# Patient Record
Sex: Female | Born: 1970 | Race: White | Hispanic: Yes | Marital: Married | Smoking: Former smoker
Health system: Southern US, Community
[De-identification: ages and names within clinical notes are randomized; demographics above are authoritative.]

## PROBLEM LIST (undated history)

## (undated) DIAGNOSIS — F3181 Bipolar II disorder: Secondary | ICD-10-CM

## (undated) HISTORY — PX: HAND SURGERY: SHX662

## (undated) HISTORY — DX: Bipolar II disorder: F31.81

## (undated) HISTORY — PX: NO PAST SURGERIES: SHX2092

---

## 2000-07-20 ENCOUNTER — Other Ambulatory Visit: Admission: RE | Admit: 2000-07-20 | Discharge: 2000-07-20 | Payer: Self-pay | Admitting: *Deleted

## 2000-09-20 ENCOUNTER — Emergency Department (HOSPITAL_COMMUNITY): Admission: EM | Admit: 2000-09-20 | Discharge: 2000-09-20 | Payer: Self-pay | Admitting: Emergency Medicine

## 2001-06-01 ENCOUNTER — Encounter: Payer: Self-pay | Admitting: Emergency Medicine

## 2001-06-01 ENCOUNTER — Emergency Department (HOSPITAL_COMMUNITY): Admission: EM | Admit: 2001-06-01 | Discharge: 2001-06-01 | Payer: Self-pay | Admitting: Emergency Medicine

## 2002-03-03 ENCOUNTER — Emergency Department (HOSPITAL_COMMUNITY): Admission: EM | Admit: 2002-03-03 | Discharge: 2002-03-03 | Payer: Self-pay | Admitting: Emergency Medicine

## 2002-05-20 ENCOUNTER — Other Ambulatory Visit: Admission: RE | Admit: 2002-05-20 | Discharge: 2002-05-20 | Payer: Self-pay | Admitting: Obstetrics & Gynecology

## 2002-06-10 ENCOUNTER — Ambulatory Visit (HOSPITAL_COMMUNITY): Admission: RE | Admit: 2002-06-10 | Discharge: 2002-06-10 | Payer: Self-pay | Admitting: Obstetrics & Gynecology

## 2002-06-10 ENCOUNTER — Encounter: Payer: Self-pay | Admitting: Obstetrics & Gynecology

## 2005-04-02 ENCOUNTER — Emergency Department (HOSPITAL_COMMUNITY): Admission: EM | Admit: 2005-04-02 | Discharge: 2005-04-03 | Payer: Self-pay | Admitting: Emergency Medicine

## 2005-04-03 IMAGING — CR DG CHEST 2V
2 series · 2 of 2 positions shown · non-contrast
Comparison: None

CLINICAL DATA: Chest pain

CHEST - 2 VIEW:

[w chest pa]
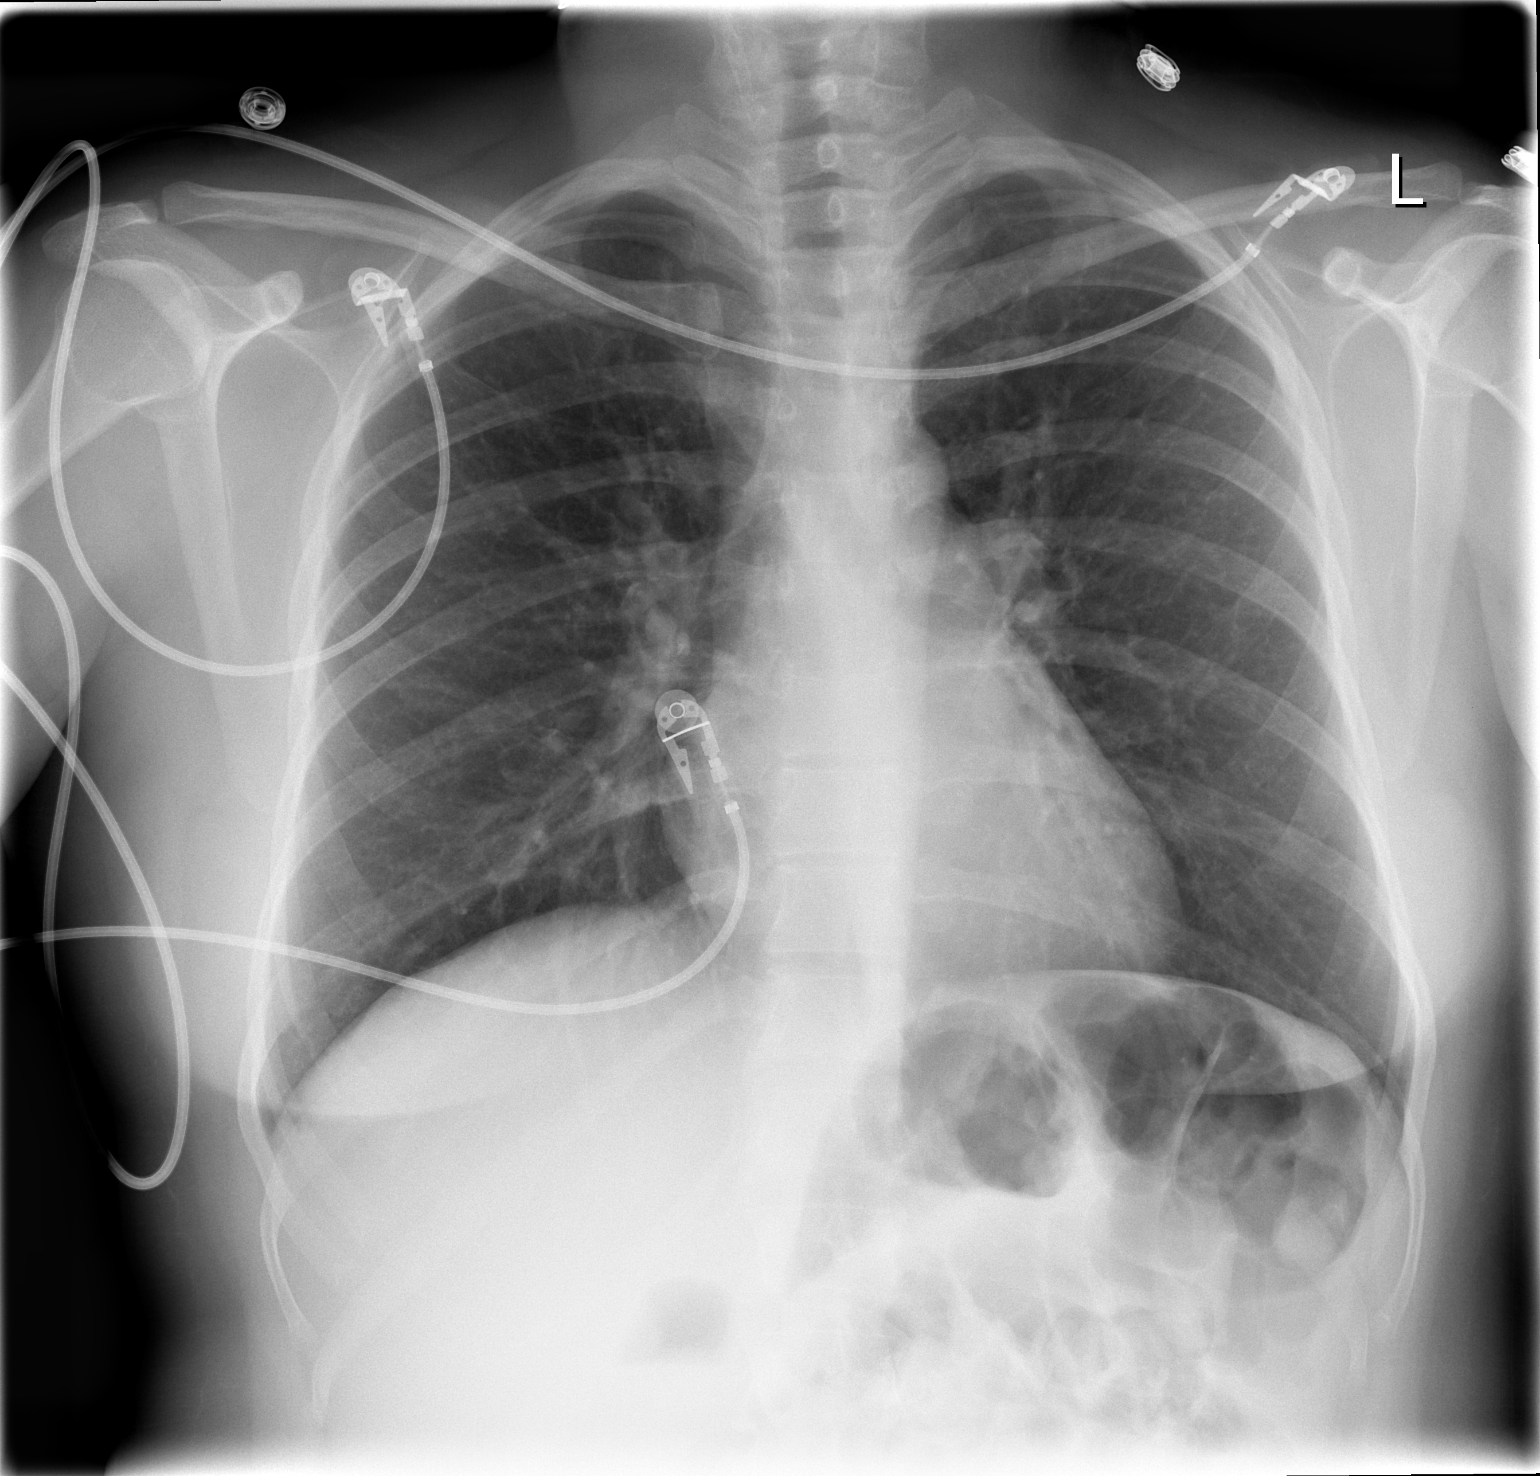

[w chest lat]
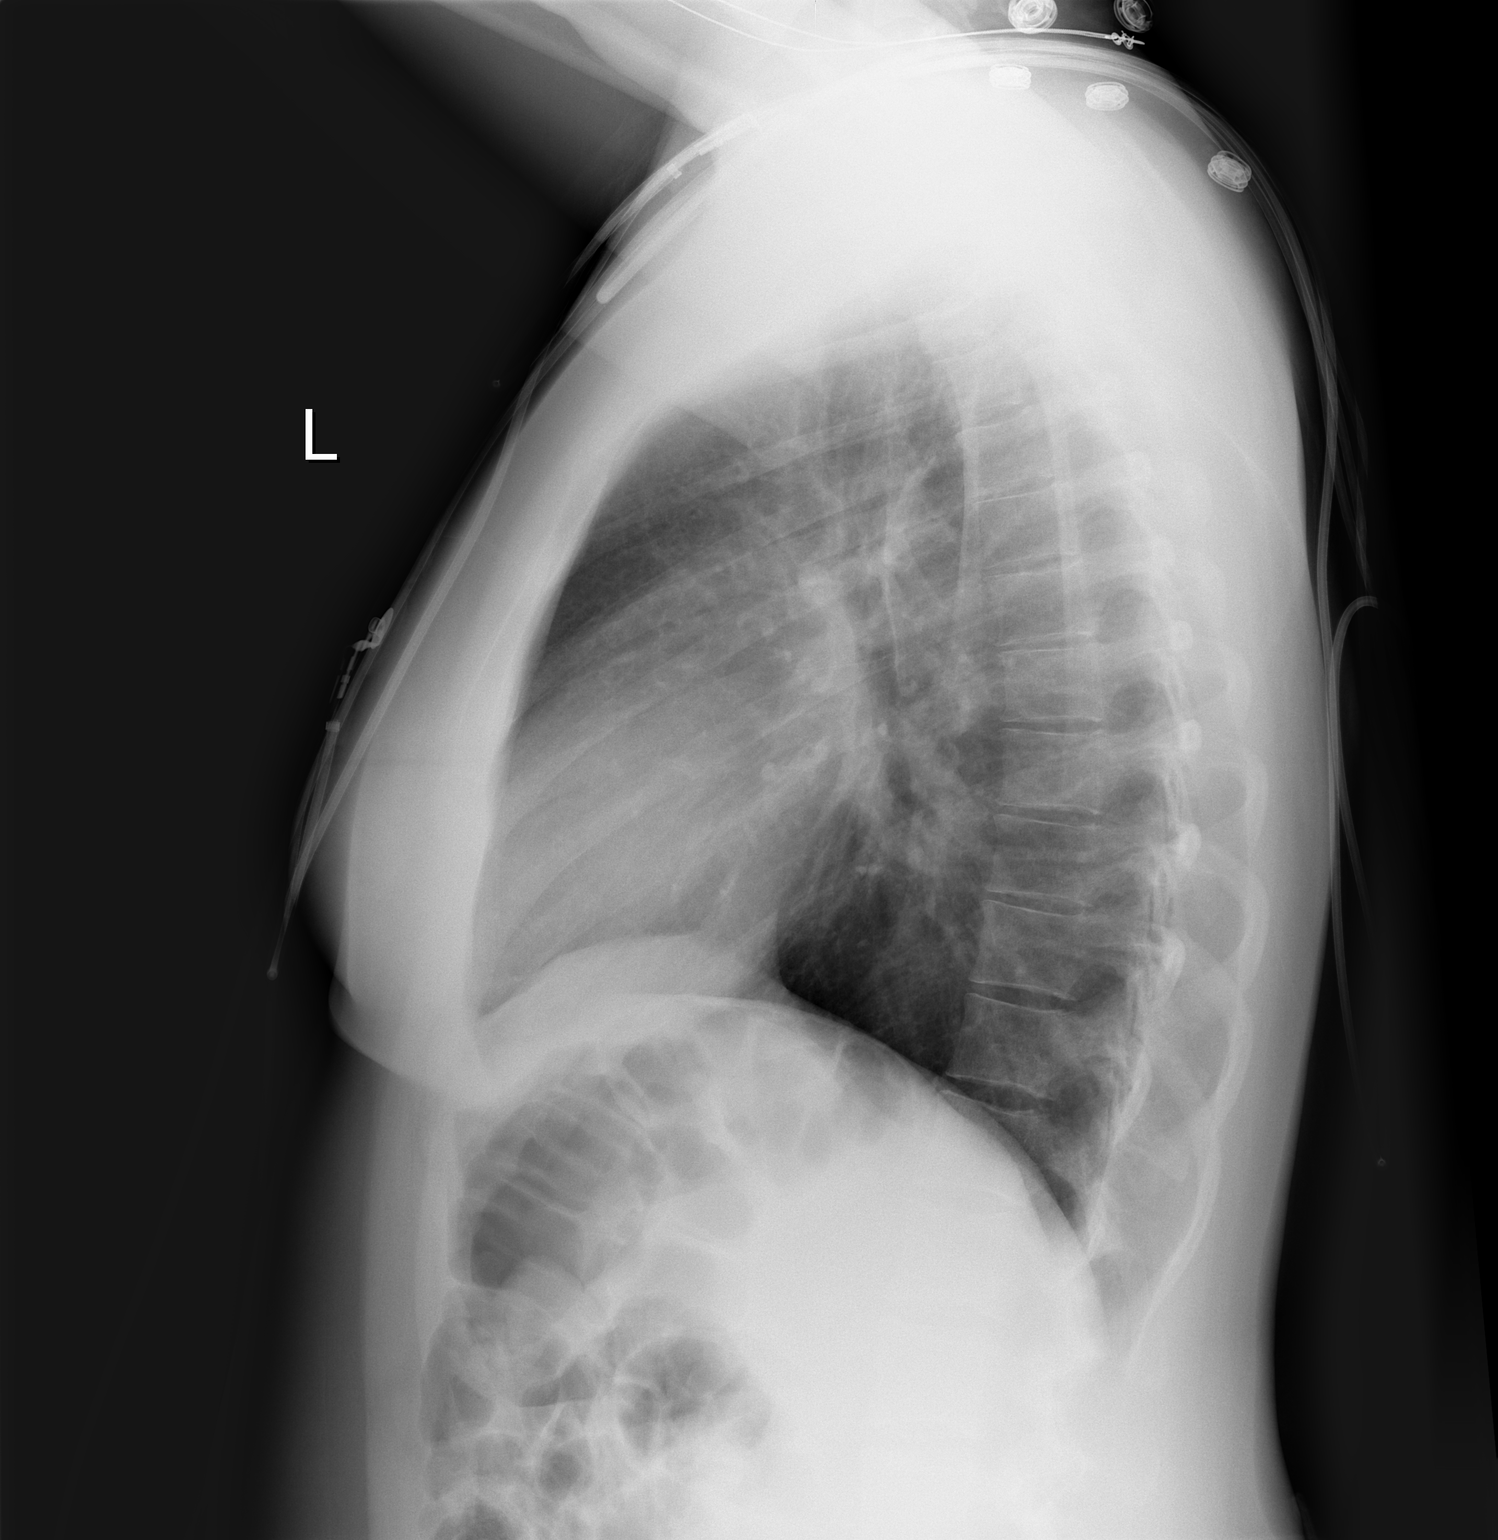

[2 of 2 positions shown; findings below may reference images not displayed]

FINDINGS: The heart size and mediastinal contours are within normal limits. 
Both lungs are clear.  The visualized skeletal structures are unremarkable.
IMPRESSION: No active cardiopulmonary disease

## 2006-05-11 ENCOUNTER — Emergency Department (HOSPITAL_COMMUNITY): Admission: EM | Admit: 2006-05-11 | Discharge: 2006-05-11 | Payer: Self-pay | Admitting: Emergency Medicine

## 2006-06-04 ENCOUNTER — Ambulatory Visit (HOSPITAL_BASED_OUTPATIENT_CLINIC_OR_DEPARTMENT_OTHER): Admission: RE | Admit: 2006-06-04 | Discharge: 2006-06-04 | Payer: Self-pay | Admitting: General Surgery

## 2011-11-21 ENCOUNTER — Ambulatory Visit (HOSPITAL_COMMUNITY): Payer: Self-pay | Admitting: Psychology

## 2011-11-25 ENCOUNTER — Ambulatory Visit (HOSPITAL_COMMUNITY): Payer: Self-pay | Admitting: Psychiatry

## 2011-12-02 ENCOUNTER — Ambulatory Visit (HOSPITAL_COMMUNITY): Payer: Self-pay | Admitting: Psychology

## 2015-11-02 ENCOUNTER — Other Ambulatory Visit: Payer: Self-pay | Admitting: Obstetrics & Gynecology

## 2015-11-02 DIAGNOSIS — N6325 Unspecified lump in the left breast, overlapping quadrants: Secondary | ICD-10-CM

## 2015-11-02 DIAGNOSIS — N6320 Unspecified lump in the left breast, unspecified quadrant: Principal | ICD-10-CM

## 2015-11-09 ENCOUNTER — Ambulatory Visit
Admission: RE | Admit: 2015-11-09 | Discharge: 2015-11-09 | Disposition: A | Discharge status: Home / Self Care | Payer: BLUE CROSS/BLUE SHIELD | Source: Ambulatory Visit | Attending: Obstetrics & Gynecology | Admitting: Obstetrics & Gynecology

## 2015-11-09 DIAGNOSIS — N6325 Unspecified lump in the left breast, overlapping quadrants: Secondary | ICD-10-CM

## 2015-11-09 DIAGNOSIS — N6320 Unspecified lump in the left breast, unspecified quadrant: Principal | ICD-10-CM

## 2015-11-09 IMAGING — MG 2D DIGITAL DIAGNOSTIC BILATERAL MAMMOGRAM WITH CAD AND ADJUNCT T
8 of 12 series · 8 of 28 positions shown · non-contrast
Comparison: Previous exam(s).

CLINICAL DATA: Ordering physician describes a palpable lump in the
lower left breast.

EXAM:
2D DIGITAL DIAGNOSTIC BILATERAL MAMMOGRAM WITH CAD AND ADJUNCT TOMO
ULTRASOUND LEFT BREAST

[R CC]
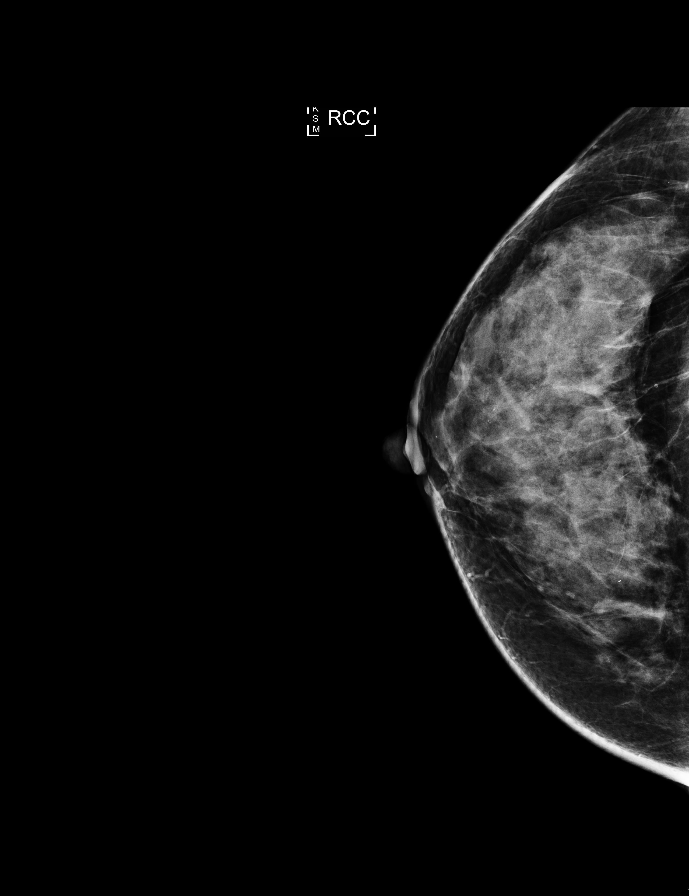

[R MLO]
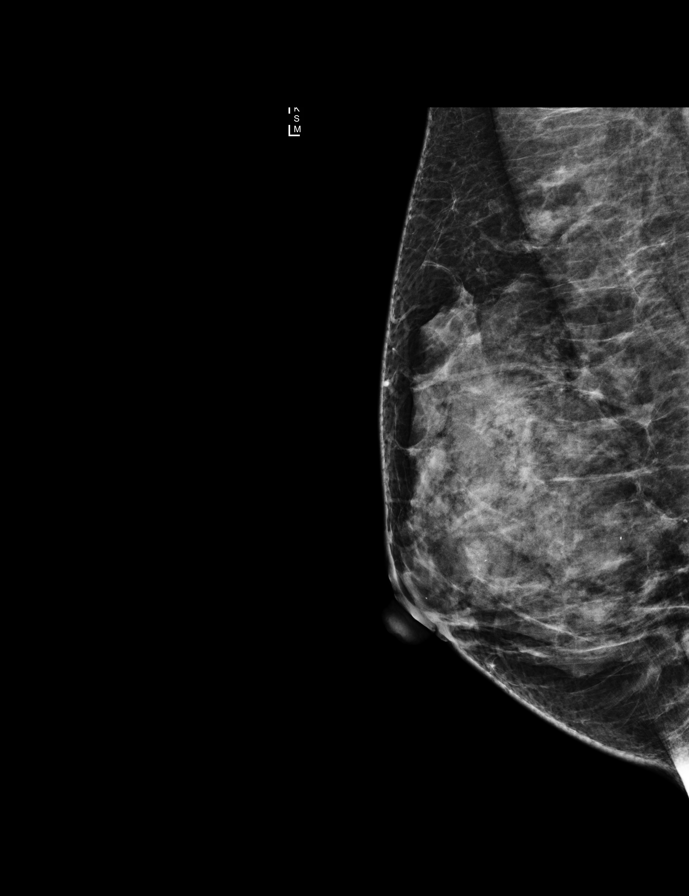

[L CC]
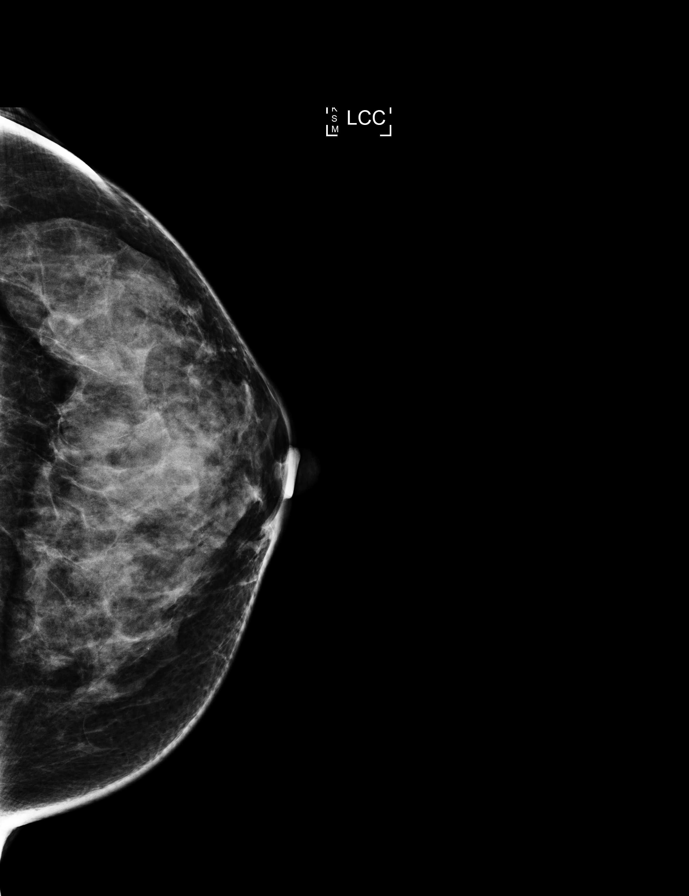

[R CC synth-2D]
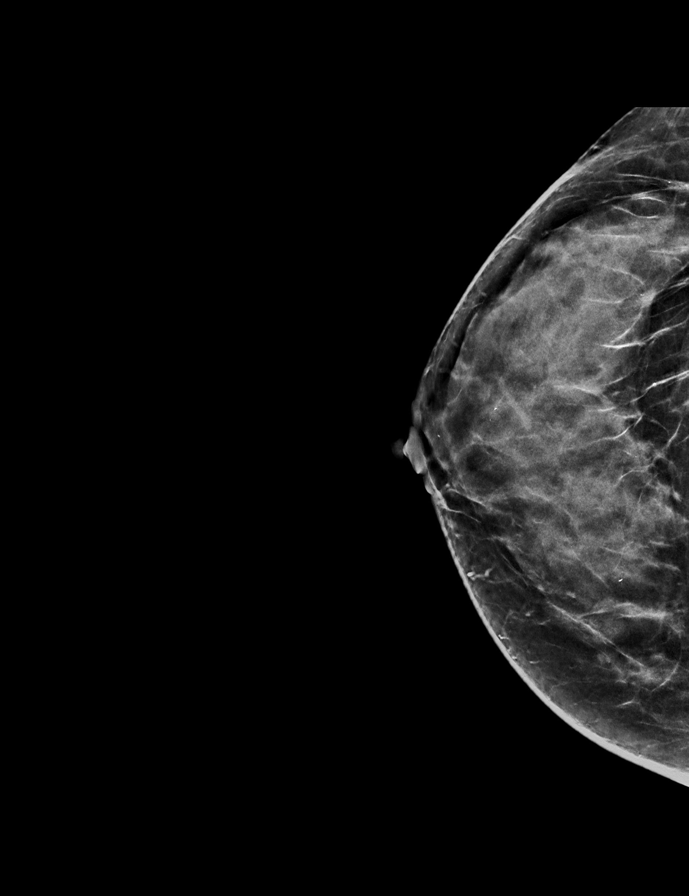

[R MLO synth-2D]
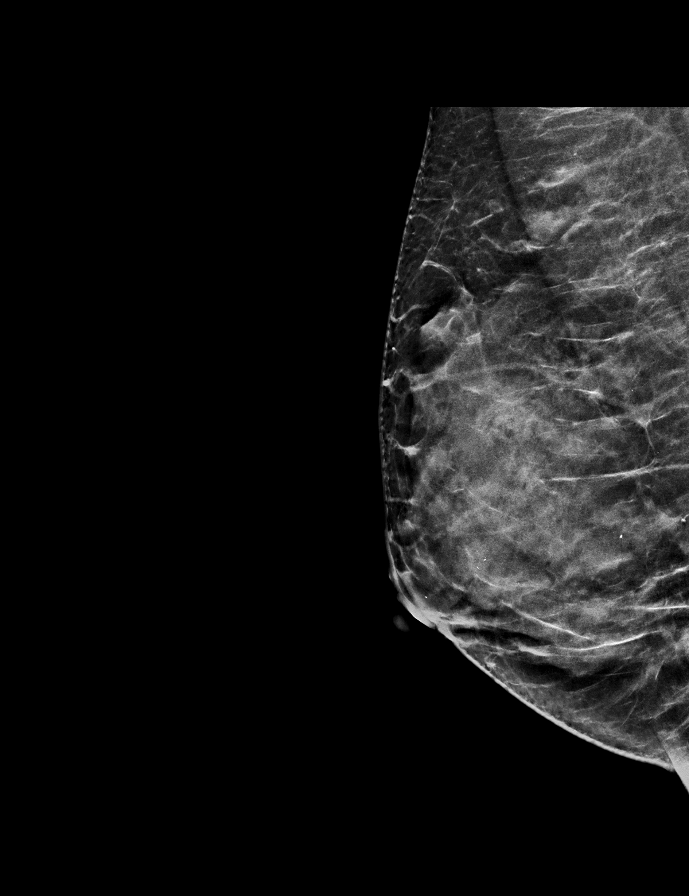

[L MLO]
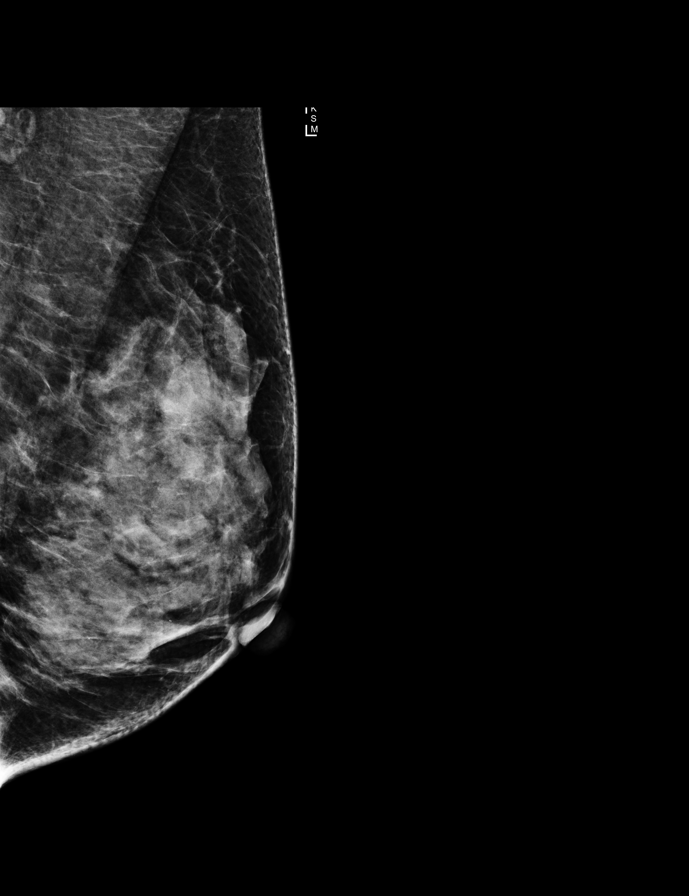

[L CC synth-2D]
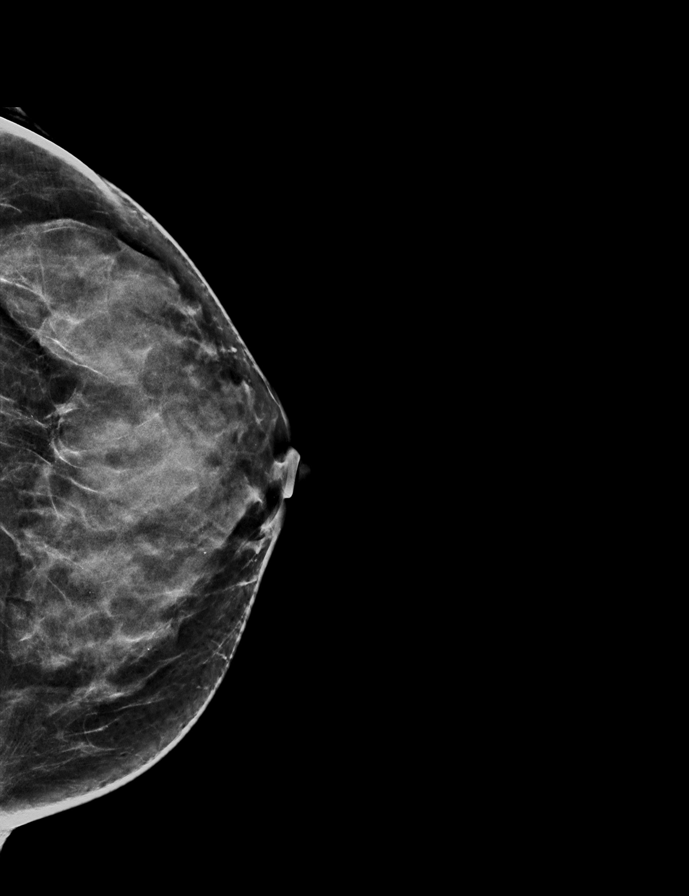

[L MLO synth-2D]
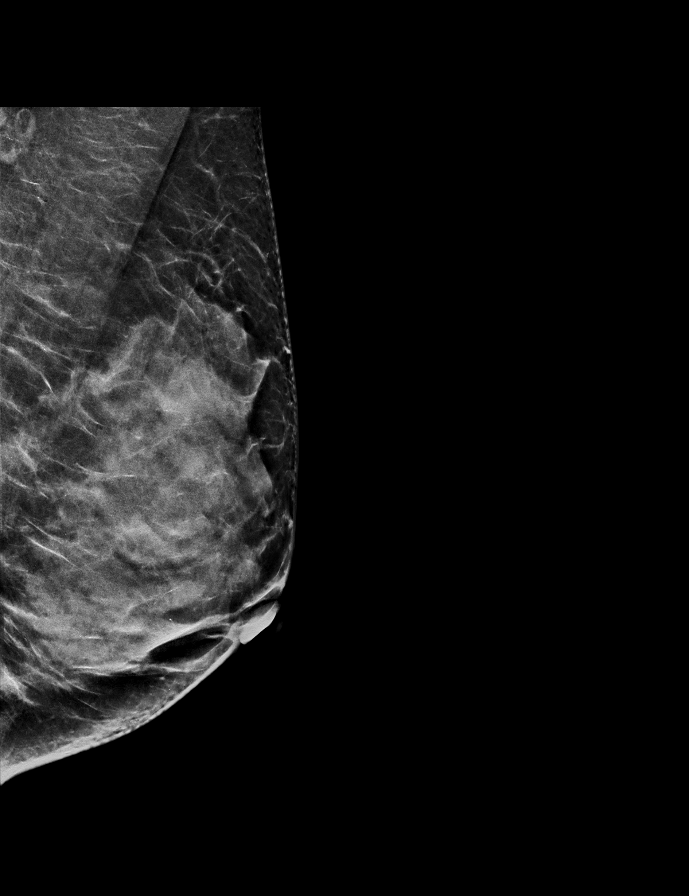

[8 of 28 positions shown; findings below may reference images not displayed]

ACR Breast Density Category d: The breast tissue is extremely dense,
which lowers the sensitivity of mammography.
FINDINGS: 2D CC and MLO views were obtained of each breast, with additional 3D
tomosynthesis. There are no dominant masses, suspicious
calcifications or secondary signs of malignancy within either
breast.

Mammographic images were processed with CAD.

Targeted ultrasound is performed, evaluating the lower left breast
from the [DATE] to [DATE] axes. There are scattered benign cysts within
the lower left breast, some simple and some complicated with
internal debris, largest measuring 4 mm. There are no suspicious
solid or cystic masses identified within the lower left breast.
IMPRESSION: No evidence of malignancy within either breast.

Scattered benign cysts within the lower left breast, corresponding
to the area of palpable abnormality.

RECOMMENDATION:
Screening mammogram in one year.(Code:[KB])

I have discussed the findings and recommendations with the patient.
Results were also provided in writing at the conclusion of the
visit. If applicable, a reminder letter will be sent to the patient
regarding the next appointment.

BI-RADS CATEGORY  2: Benign.

## 2015-11-09 IMAGING — US ULTRASOUND LEFT BREAST LIMITED
1 series · 11 of 11 positions shown · non-contrast
Comparison: Previous exam(s).

CLINICAL DATA: Ordering physician describes a palpable lump in the
lower left breast.

EXAM:
2D DIGITAL DIAGNOSTIC BILATERAL MAMMOGRAM WITH CAD AND ADJUNCT TOMO
ULTRASOUND LEFT BREAST

[Series 1: ultrasound left breast limited · 0.04mm/px · 11 of 11 slices shown]
[im 1/11]
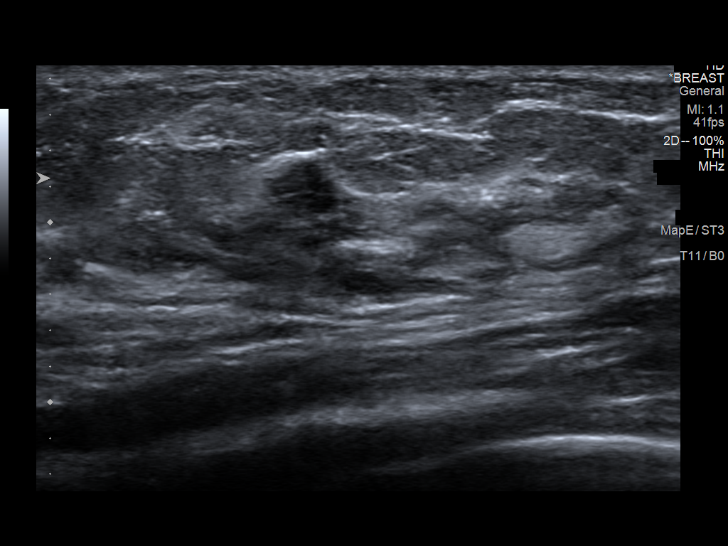
[im 2/11]
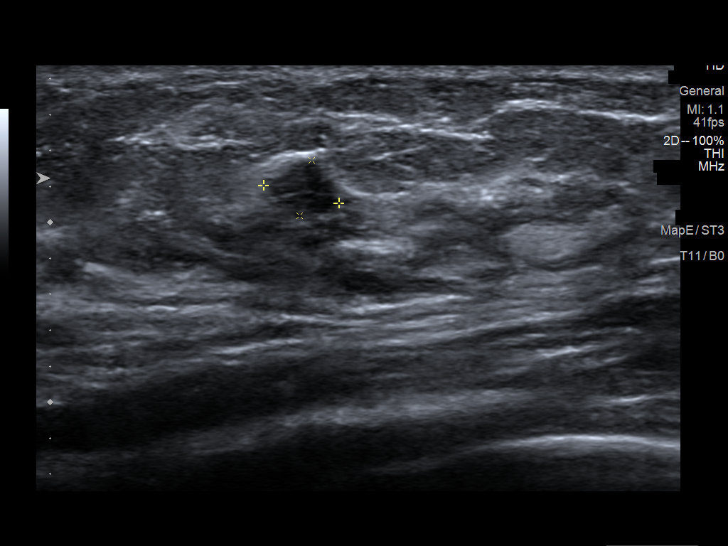
[im 3/11]
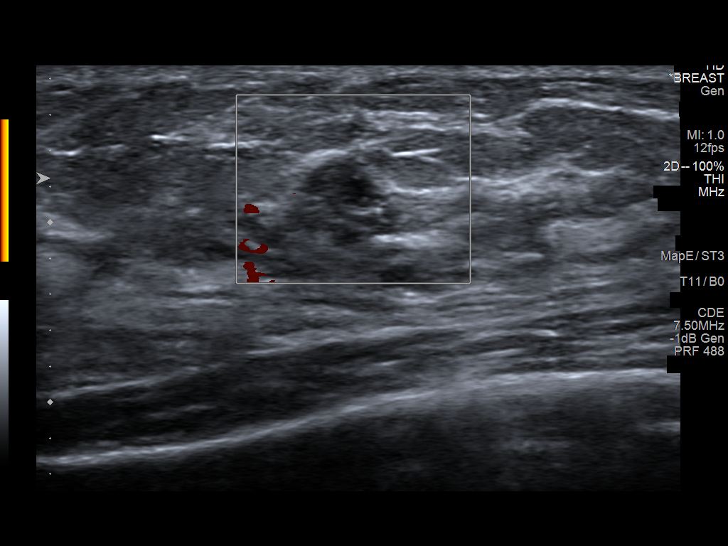
[im 4/11]
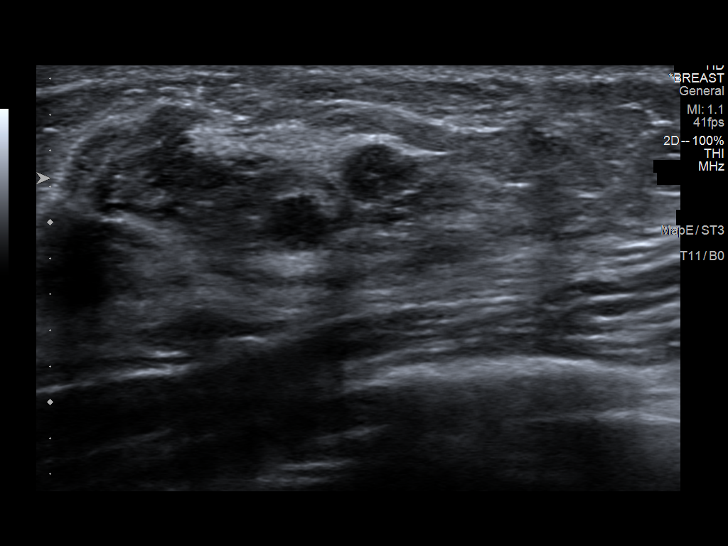
[im 5/11]
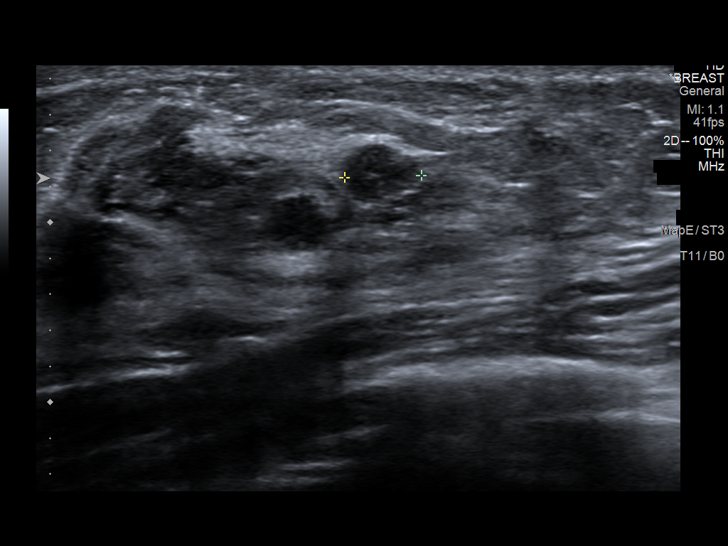
[im 6/11]
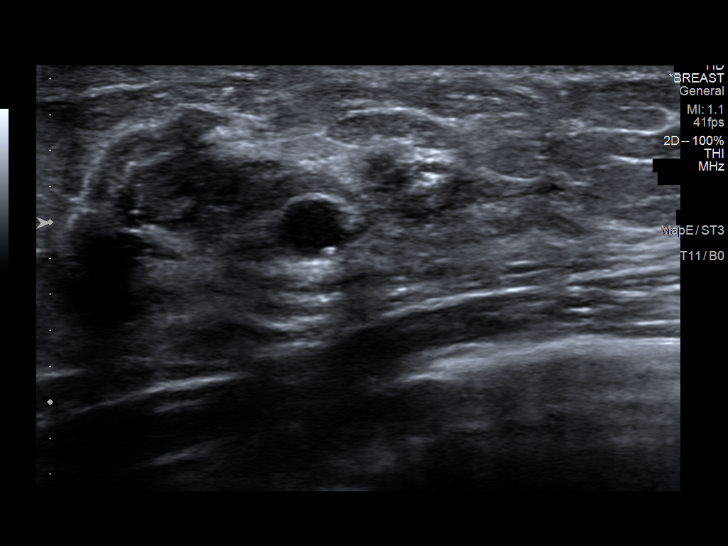
[im 7/11]
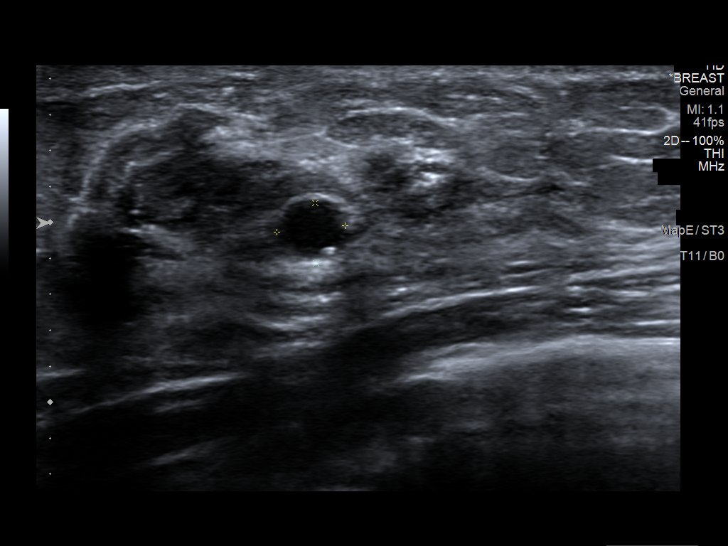
[im 8/11]
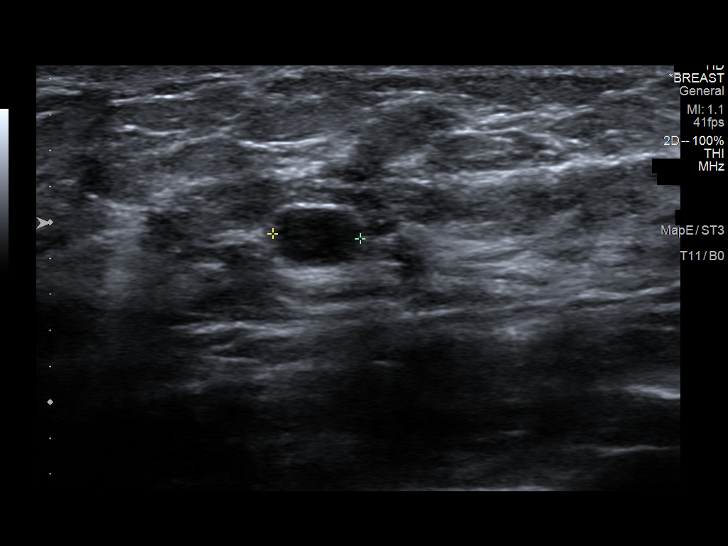
[im 9/11]
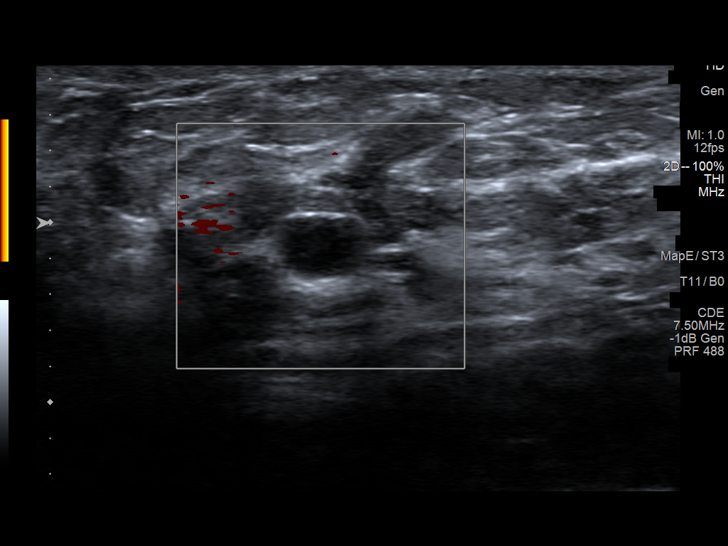
[im 10/11]
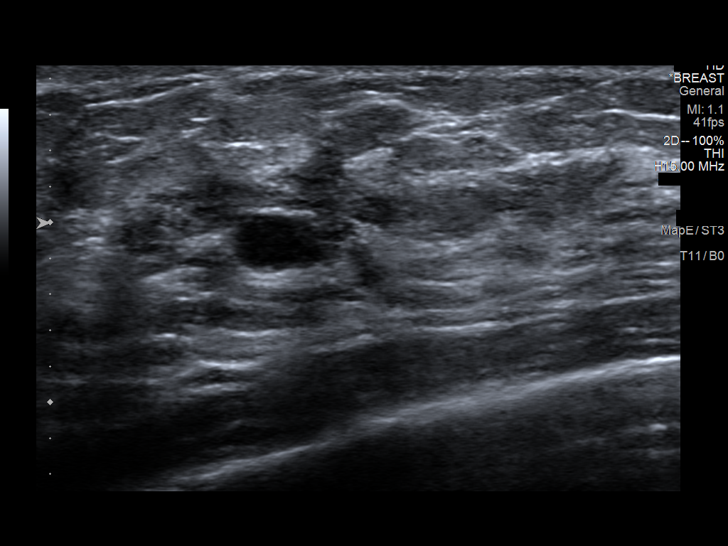
[im 11/11]
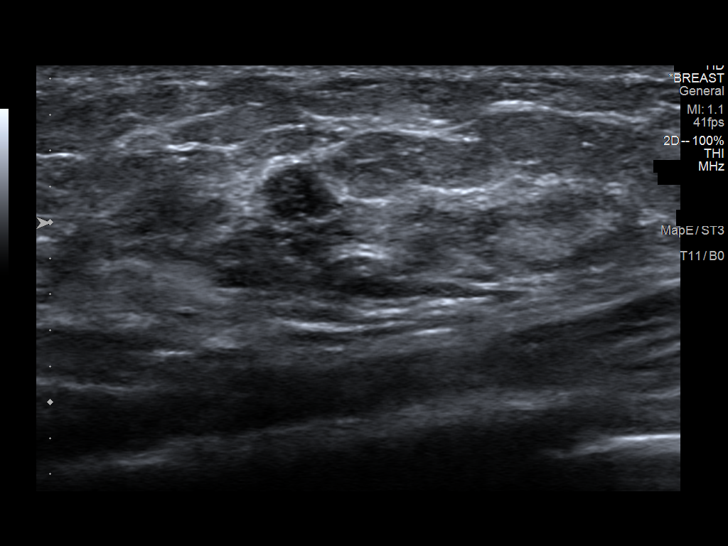

[11 of 11 positions shown; findings below may reference images not displayed]

ACR Breast Density Category d: The breast tissue is extremely dense,
which lowers the sensitivity of mammography.
FINDINGS: 2D CC and MLO views were obtained of each breast, with additional 3D
tomosynthesis. There are no dominant masses, suspicious
calcifications or secondary signs of malignancy within either
breast.

Mammographic images were processed with CAD.

Targeted ultrasound is performed, evaluating the lower left breast
from the [DATE] to [DATE] axes. There are scattered benign cysts within
the lower left breast, some simple and some complicated with
internal debris, largest measuring 4 mm. There are no suspicious
solid or cystic masses identified within the lower left breast.
IMPRESSION: No evidence of malignancy within either breast.

Scattered benign cysts within the lower left breast, corresponding
to the area of palpable abnormality.

RECOMMENDATION:
Screening mammogram in one year.(Code:[KB])

I have discussed the findings and recommendations with the patient.
Results were also provided in writing at the conclusion of the
visit. If applicable, a reminder letter will be sent to the patient
regarding the next appointment.

BI-RADS CATEGORY  2: Benign.

## 2016-06-12 ENCOUNTER — Ambulatory Visit (INDEPENDENT_AMBULATORY_CARE_PROVIDER_SITE_OTHER): Payer: BLUE CROSS/BLUE SHIELD | Admitting: Family Medicine

## 2016-06-12 ENCOUNTER — Encounter: Payer: Self-pay | Admitting: Family Medicine

## 2016-06-12 VITALS — BP 104/62 | HR 81 | Temp 98.5°F | Ht 61.0 in | Wt 123.0 lb

## 2016-06-12 DIAGNOSIS — J111 Influenza due to unidentified influenza virus with other respiratory manifestations: Secondary | ICD-10-CM | POA: Diagnosis not present

## 2016-06-12 DIAGNOSIS — F3181 Bipolar II disorder: Secondary | ICD-10-CM

## 2016-06-12 DIAGNOSIS — Z13220 Encounter for screening for lipoid disorders: Secondary | ICD-10-CM

## 2016-06-12 LAB — COMPREHENSIVE METABOLIC PANEL
ALBUMIN: 3.9 g/dL (ref 3.5–5.2)
ALT: 14 U/L (ref 0–35)
AST: 5 U/L (ref 0–37)
Alkaline Phosphatase: 43 U/L (ref 39–117)
BILIRUBIN TOTAL: 0.2 mg/dL (ref 0.2–1.2)
BUN: 6 mg/dL (ref 6–23)
CALCIUM: 8.3 mg/dL — AB (ref 8.4–10.5)
CO2: 29 mEq/L (ref 19–32)
CREATININE: 0.76 mg/dL (ref 0.40–1.20)
Chloride: 104 mEq/L (ref 96–112)
GFR: 87.04 mL/min (ref >60.00)
Glucose, Bld: 108 mg/dL — ABNORMAL HIGH (ref 70–99)
Potassium: 3.9 mEq/L (ref 3.5–5.1)
Sodium: 137 mEq/L (ref 135–145)
Total Protein: 5.6 g/dL — ABNORMAL LOW (ref 6.0–8.3)

## 2016-06-12 LAB — LIPID PANEL
CHOLESTEROL: 153 mg/dL (ref 0–200)
HDL: 46.5 mg/dL (ref >39.00)
LDL Cholesterol: 73 mg/dL (ref 0–99)
NonHDL: 106.27
TRIGLYCERIDES: 165 mg/dL — AB (ref 0.0–149.0)
Total CHOL/HDL Ratio: 3
VLDL: 33 mg/dL (ref 0.0–40.0)

## 2016-06-12 LAB — CBC WITH DIFFERENTIAL/PLATELET
BASOS PCT: 0.5 % (ref 0.0–3.0)
Basophils Absolute: 0 10*3/uL (ref 0.0–0.1)
EOS PCT: 8.4 % — AB (ref 0.0–5.0)
Eosinophils Absolute: 0.4 10*3/uL (ref 0.0–0.7)
HEMATOCRIT: 37.2 % (ref 36.0–46.0)
Hemoglobin: 12.4 g/dL (ref 12.0–15.0)
LYMPHS ABS: 2 10*3/uL (ref 0.7–4.0)
LYMPHS PCT: 42.1 % (ref 12.0–46.0)
MCHC: 33.2 g/dL (ref 30.0–36.0)
MCV: 80 fl (ref 78.0–100.0)
MONOS PCT: 9.1 % (ref 3.0–12.0)
Monocytes Absolute: 0.4 10*3/uL (ref 0.1–1.0)
NEUTROS ABS: 1.9 10*3/uL (ref 1.4–7.7)
Neutrophils Relative %: 39.9 % — ABNORMAL LOW (ref 43.0–77.0)
PLATELETS: 223 10*3/uL (ref 150.0–400.0)
RBC: 4.65 Mil/uL (ref 3.87–5.11)
RDW: 12.6 % (ref 11.5–15.5)
WBC: 4.7 10*3/uL (ref 4.0–10.5)

## 2016-06-12 NOTE — Progress Notes (Signed)
Chief Complaint  Patient presents with  . Establish Care    cough and flu       New Patient Visit SUBJECTIVE: HPI: Linda Underwood is an 46 y.o.female who is being seen for establishing care. She is here with her husband.  The patient was previously seen at [facility]. Changed over due to insurance coverage.  She follows with [facility] for Bipolar 2. She is currently being weaned off Klonopin and taking Lamictal. Her psychiatrist wants her to get labs to follow-up monitoring of this medicine. She feels she is doing well on the medication not having any issues. She has no thoughts of harming herself or others.  The patient was also diagnosed with the flu yesterday. She is currently taking Tamiflu. She is also using ibuprofen every 8 hours as needed. She is not using Tylenol. She does not feel this is frequent enough to help control her symptoms.   Not on File  Past Medical History:  Diagnosis Date  . Bipolar 2 disorder [facility])    Past Surgical History:  Procedure Laterality Date  . NO PAST SURGERIES     Social History   Social History  . Marital status: Married   Social History Main Topics  . Smoking status: Former Smoker    Types: Cigarettes  . Smokeless tobacco: Never Used  . Alcohol use No  . Drug use: No  . Sexual activity: Not on file   Other Topics Concern  . Not on file   Social History Narrative  . No narrative on file   History reviewed. No pertinent family history.   Current Outpatient Prescriptions:  .  clonazePAM (KLONOPIN) 1 MG tablet, Take 1 mg by mouth daily., Disp: , Rfl:  .  dextromethorphan-guaiFENesin (MUCINEX DM) 30-600 MG 12hr tablet, Take 1 tablet by mouth 2 (two) times daily., Disp: , Rfl:  .  ibuprofen (ADVIL,MOTRIN) 600 MG tablet, Take 600 mg by mouth every 8 (eight) hours as needed., Disp: , Rfl:  .  lamoTRIgine (LAMICTAL) 150 MG tablet, Take 150 mg by mouth daily., Disp: , Rfl:  .  oseltamivir (TAMIFLU) 75 MG capsule, Take 75  mg by mouth daily., Disp: , Rfl:   ROS MSK: +muscle aches  Psych: Denies HI or SI   OBJECTIVE: BP 104/62 (BP Location: Left Arm, Patient Position: Sitting, Cuff Size: Normal)   Pulse 81   Temp 98.5 F (36.9 C) (Oral)   Ht 5\' 1"  (1.549 m)   Wt 123 lb (55.8 kg)   BMI 23.24 kg/m   Constitutional: -  VS reviewed -  Well developed, well nourished, appears stated age -  No apparent distress  Psychiatric: -  Oriented to person, place, and time -  Memory intact -  Affect and mood normal -  Fluent conversation, good eye contact -  Judgment and insight age appropriate  Eye: -  Conjunctivae clear, no discharge -  Pupils symmetric, round, reactive to light  ENMT: -  Ears are patent b/l without erythema or discharge. TM's are shiny and clear b/l without evidence of effusion or infection. -  Oral mucosa without lesions, tongue and uvula midline    Tonsils not enlarged, no erythema, no exudate, trachea midline    Pharynx moist, no lesions, no erythema  Neck: -  No gross swelling, no palpable masses -  Thyroid midline, not enlarged, mobile, no palpable masses  Cardiovascular: -  RRR, no murmurs -  No LE edema  Respiratory: -  Normal respiratory effort, no  accessory muscle use, no retraction -  Breath sounds equal, no wheezes, no ronchi, no crackles  Musculoskeletal: -  No clubbing, no cyanosis -  Gait normal  Skin: -  No significant lesion on inspection -  Warm and dry to palpation   ASSESSMENT/PLAN: Bipolar 2 disorder (HCC) - Plan: Comprehensive metabolic panel, CBC w/Diff  Influenza  Screening cholesterol level - Plan: Lipid panel  Patient instructed to sign release of records form from her previous PCP. We'll obtain labs to monitor Lamictal. She is also due for screening lipid panel. Continue Tamiflu and continue to push fluids, practice good hand hygiene, and covering her mouth when she coughs. She will let us know or seek emergent care if she starts having high fevers,  shortness of breath, or worsening symptoms. Patient should return in 1 yr for CPE or prn. The patient and her husband voiced understanding and agreement to the plan.   Jilda Rocheicholas Paul [place], DO 06/12/16  4:40 PM

## 2016-06-12 NOTE — Patient Instructions (Addendum)
600 mg ibuprofen every 6 hours can be combined with 1000 mg Tylenol (extra strength 2 tabs) every 6 hours. OK to stagger and alternate them every 3 hours.  Continue to push fluids, practice good hand hygiene, and cover your mouth if you cough.

## 2016-06-12 NOTE — Progress Notes (Signed)
Pre visit review using our clinic review tool, if applicable. No additional management support is needed unless otherwise documented below in the visit note. 

## 2016-06-13 ENCOUNTER — Encounter: Payer: Self-pay | Admitting: Family Medicine

## 2016-06-16 ENCOUNTER — Encounter: Payer: Self-pay | Admitting: *Deleted

## 2016-12-31 ENCOUNTER — Telehealth: Payer: Self-pay

## 2016-12-31 ENCOUNTER — Ambulatory Visit (INDEPENDENT_AMBULATORY_CARE_PROVIDER_SITE_OTHER): Payer: Self-pay | Admitting: Family Medicine

## 2016-12-31 ENCOUNTER — Ambulatory Visit: Payer: Self-pay | Admitting: Family Medicine

## 2016-12-31 ENCOUNTER — Encounter: Payer: Self-pay | Admitting: Family Medicine

## 2016-12-31 VITALS — BP 100/68 | HR 74 | Temp 98.4°F | Ht 61.0 in | Wt 125.4 lb

## 2016-12-31 DIAGNOSIS — M25562 Pain in left knee: Secondary | ICD-10-CM

## 2016-12-31 DIAGNOSIS — G8929 Other chronic pain: Secondary | ICD-10-CM

## 2016-12-31 DIAGNOSIS — H6983 Other specified disorders of Eustachian tube, bilateral: Secondary | ICD-10-CM

## 2016-12-31 MED ORDER — FLUTICASONE PROPIONATE 50 MCG/ACT NA SUSP: 2.0000 | Freq: Every day | NASAL | 2 refills | Status: DC

## 2016-12-31 MED ORDER — DICLOFENAC SODIUM 1 % TD GEL: 2.0000 g | Freq: Four times a day (QID) | TRANSDERMAL | 1 refills | Status: DC

## 2016-12-31 NOTE — Progress Notes (Signed)
Chief Complaint  Patient presents with  . Ear Pain    Pt is here for bilateral ear pressure, worse on L- feels like she is driving up a mtn. Duration: 1 week Progression: unchanged Associated symptoms: decreased hearing b/l, worse on L Denies: URI symptoms, fevers, bleeding, or discharge from ear Treatment to date: Humidifier  She also has a several year history of chronic left knee pain. She has seen an orthopedic surgeon had various imaging done. Nothing definitive was found, however they wanted to do exploratory surgery for further evaluation. She did not want this and has since received an injection that lasted for 2-3 years. The pain is starting to return. She is requesting something topical as she does not prefer to take an oral medication if possible. She is also open to another injection if necessary. No recent injury or change in activity. There is no swelling or bruising. No numbness, tingling or weakness.  ROS:  HEENT: +ear pain Costitutional: Denies fevers  Past Medical History:  Diagnosis Date  . Bipolar 2 disorder (HCC)    No family history on file. Past Surgical History:  Procedure Laterality Date  . NO PAST SURGERIES      BP 100/68 (BP Location: Left Arm, Patient Position: Sitting, Cuff Size: Normal)   Pulse 74   Temp 98.4 F (36.9 C) (Oral)   Ht 5\' 1"  (1.549 m)   Wt 125 lb 6 oz (56.9 kg)   SpO2 95%   BMI 23.69 kg/m  General: Awake, alert, appearing stated age HEENT:  L ear- Canal patent without drainage or erythema, TM is retracted, no erythema or fluid R ear- canal patent without drainage or erythema, TM is mildly retracted, no erythema or drainage Nose- nares patent and without discharge Mouth- Lips, gums and dentition unremarkable, pharynx is without erythema or exudate Neck: No adenopathy Lungs: Normal effort, no accessory muscle use MSK: L knee without TTP, no effusion or jt line TTP, neg Lachman's, varus/valgus stress, nml gait Psych: Age  appropriate judgment and insight, normal mood and affect  Dysfunction of both eustachian tubes - Plan: fluticasone (FLONASE) 50 MCG/ACT nasal spray  Chronic pain of left knee - Plan: diclofenac sodium (VOLTAREN) 1 % GEL, DISCONTINUED: diclofenac sodium (VOLTAREN) 1 % GEL  Counseled on use of INCS. Try topical NSAID. F/u for injection if no improvement. Pt voiced understanding and agreement to the plan.  Jilda Roche [place], DO 12/31/16 12:38 PM

## 2016-12-31 NOTE — Patient Instructions (Addendum)
Flonase (fluticasone); nasal spray that is over the counter. 2 sprays each nostril, once daily. Aim towards the same side eye when you spray.  Things to look out for: fevers, drainage from ear, return/worsening of pain.  If you are doing better, cancel your follow up in 2 months. We will plan to inject otherwise.

## 2016-12-31 NOTE — Progress Notes (Signed)
Pre visit review using our clinic review tool, if applicable. No additional management support is needed unless otherwise documented below in the visit note. 

## 2016-12-31 NOTE — Addendum Note (Signed)
Addended by: Scharlene Gloss B on: 12/31/2016 03:16 PM   Modules accepted: Orders

## 2016-12-31 NOTE — Telephone Encounter (Signed)
PA initiated via Covermymeds; KEY: [id number]. Received PA real-time approval.   [id number];MMNOTR:[id number];Review Type:Prior Auth;Coverage Start Date:12/01/2016;Coverage End Date:12/31/2017

## 2017-03-02 ENCOUNTER — Ambulatory Visit: Payer: Self-pay | Admitting: Family Medicine

## 2017-05-20 ENCOUNTER — Ambulatory Visit (INDEPENDENT_AMBULATORY_CARE_PROVIDER_SITE_OTHER): Payer: Self-pay | Admitting: Family Medicine

## 2017-05-20 ENCOUNTER — Encounter: Payer: Self-pay | Admitting: Family Medicine

## 2017-05-20 VITALS — BP 94/64 | HR 82 | Temp 98.4°F | Ht 61.0 in | Wt 131.5 lb

## 2017-05-20 DIAGNOSIS — N926 Irregular menstruation, unspecified: Secondary | ICD-10-CM

## 2017-05-20 DIAGNOSIS — J208 Acute bronchitis due to other specified organisms: Secondary | ICD-10-CM

## 2017-05-20 DIAGNOSIS — B9689 Other specified bacterial agents as the cause of diseases classified elsewhere: Secondary | ICD-10-CM

## 2017-05-20 LAB — POCT URINE PREGNANCY: Preg Test, Ur: NEGATIVE

## 2017-05-20 MED ORDER — BENZONATATE 100 MG PO CAPS: 100.0000 mg | ORAL_CAPSULE | Freq: Three times a day (TID) | ORAL | 0 refills | Status: DC | PRN

## 2017-05-20 MED ORDER — AZITHROMYCIN 250 MG PO TABS: ORAL_TABLET | ORAL | 0 refills | Status: DC

## 2017-05-20 NOTE — Progress Notes (Signed)
Chief Complaint  Patient presents with  . Cough    congestion  . Fatigue    Linda Underwood here for URI complaints.  Duration: 1 month  Associated symptoms: wheezing, subjective fever, sinus congestion, rhinorrhea, itchy watery eyes, ear pain and cough Denies: sinus pain, ear drainage, wheezing, shortness of breath and rigors Treatment to date: Cold and flu, otc cough syrup Sick contacts: Yes- family had it but all have gotten better  2 weeks late on period.   ROS:  Const: Denies fevers HEENT: As noted in HPI Lungs: +cough  Past Medical History:  Diagnosis Date  . Bipolar 2 disorder (HCC)    BP 94/64 (BP Location: Left Arm, Patient Position: Sitting, Cuff Size: Normal)   Pulse 82   Temp 98.4 F (36.9 C) (Oral)   Ht 5\' 1"  (1.549 m)   Wt 131 lb 8 oz (59.6 kg)   SpO2 97%   BMI 24.85 kg/m  General: Awake, alert, appears stated age HEENT: AT, [place], ears patent b/l and TM's neg, nares patent w/o discharge, pharynx pink and without exudates, MMM Neck: No masses or asymmetry Heart: RRR Lungs: CTAB, no accessory muscle use Psych: Age appropriate judgment and insight, normal mood and affect  Acute bacterial bronchitis - Plan: azithromycin (ZITHROMAX) 250 MG tablet, benzonatate (TESSALON) 100 MG capsule  Missed period  Orders as above. Urine preg neg. Recommended discussing with GYN some form of birth control. She has an appt coming up. I did offer to rx OCP's vs LARC's, though she declined.  Continue to push fluids, practice good hand hygiene, cover mouth when coughing. F/u prn. If starting to experience fevers, shaking, or shortness of breath, seek immediate care. Pt voiced understanding and agreement to the plan.  Jilda Rocheicholas Paul [place], DO 05/20/17 1:27 PM

## 2017-05-20 NOTE — Patient Instructions (Addendum)
Continue to push fluids, practice good hand hygiene, and cover your mouth if you cough.  If you start having fevers, shaking or shortness of breath, seek immediate care..  Send me a MyChart message if the cough medicine is not helpful.   Let us know if you need anything.

## 2017-05-20 NOTE — Addendum Note (Signed)
Addended by: Scharlene GlossEWING, Lucille Witts B on: 05/20/2017 02:00 PM   Modules accepted: Orders

## 2017-05-20 NOTE — Progress Notes (Signed)
Pre visit review using our clinic review tool, if applicable. No additional management support is needed unless otherwise documented below in the visit note. 

## 2017-06-08 ENCOUNTER — Ambulatory Visit: Payer: Self-pay

## 2017-06-08 NOTE — Telephone Encounter (Signed)
[facility], unable to leave message as mailbox is full / Will route to the office and see if they can follow up with the patient or respond back to her via mychart

## 2017-06-08 NOTE — Telephone Encounter (Signed)
Called the patient no answer/mailbox full. 

## 2017-06-15 ENCOUNTER — Encounter: Payer: Self-pay | Admitting: Family Medicine

## 2017-06-15 DIAGNOSIS — Z0289 Encounter for other administrative examinations: Secondary | ICD-10-CM

## 2017-06-16 ENCOUNTER — Encounter: Payer: Self-pay | Admitting: Family Medicine

## 2017-06-22 ENCOUNTER — Encounter: Payer: Self-pay | Admitting: Family Medicine

## 2017-06-22 ENCOUNTER — Ambulatory Visit (INDEPENDENT_AMBULATORY_CARE_PROVIDER_SITE_OTHER): Payer: BLUE CROSS/BLUE SHIELD | Admitting: Family Medicine

## 2017-06-22 VITALS — BP 92/64 | HR 83 | Temp 98.5°F | Ht 60.0 in | Wt 130.0 lb

## 2017-06-22 DIAGNOSIS — J45909 Unspecified asthma, uncomplicated: Secondary | ICD-10-CM

## 2017-06-22 MED ORDER — BECLOMETHASONE DIPROPIONATE 40 MCG/ACT IN AERS: 2.0000 | INHALATION_SPRAY | Freq: Two times a day (BID) | RESPIRATORY_TRACT | 1 refills | Status: DC

## 2017-06-22 MED ORDER — ALBUTEROL SULFATE 108 (90 BASE) MCG/ACT IN AEPB
2.0000 | INHALATION_SPRAY | Freq: Four times a day (QID) | RESPIRATORY_TRACT | 0 refills | Status: DC | PRN
Start: 2017-06-22 — End: 2019-11-21

## 2017-06-22 NOTE — Patient Instructions (Addendum)
Remember to rinse your mouth out after each use of the Qvar.  Albuterol can be used before you decide to go in the cold or exercise. Avoid irritating things like smoke or chemicals.   Continue to push fluids, practice good hand hygiene, and cover your mouth if you cough.  If you start having fevers, shaking or shortness of breath, seek immediate care.  Let us know if you need anything.

## 2017-06-22 NOTE — Progress Notes (Signed)
Chief Complaint  Patient presents with  . Nasal Congestion    Pt states she's been dealing with cold symptoms since Dec.2018. States symtoms might stop for a few days but seem to keep coming back.   . Cough  . Shortness of Breath    Linda Underwood here for URI complaints.  Duration: 2 weeks  Associated symptoms: wheezing, shortness of breath, chest tightness and cough Denies: sinus congestion, sinus pain, rhinorrhea, itchy watery eyes, ear pain, ear drainage, sore throat, myalgia and fevers Treatment to date: Tylenol, Advil; received Zpak and tessalon which slightly helped Worse when she goes into cold or is exposed to smoke. Sick contacts: No  ROS:  Const: Denies fevers HEENT: As noted in HPI Lungs: No current SOB  Past Medical History:  Diagnosis Date  . Bipolar 2 disorder (HCC)    BP 92/64   Pulse 83   Temp 98.5 F (36.9 C) (Oral)   Ht 5' (1.524 m)   Wt 130 lb (59 kg)   SpO2 95%   BMI 25.39 kg/m  General: Awake, alert, appears stated age HEENT: AT, [place], ears patent b/l and TM's neg, nares patent w/o discharge, pharynx pink and without exudates, MMM Neck: No masses or asymmetry Heart: RRR Lungs: CTAB, no accessory muscle use Psych: Age appropriate judgment and insight, normal mood and affect  Extrinsic asthma without complication, unspecified asthma severity, unspecified whether persistent - Plan: Albuterol Sulfate 108 (90 Base) MCG/ACT AEPB, beclomethasone (QVAR) 40 MCG/ACT inhaler  Orders as above. Given duration and complaints, sounds like allergy induced asthma. No likely post infectious cough. Rinse mouth out after using Qvar.  Continue to push fluids, practice good hand hygiene, cover mouth when coughing. F/u in 4 weeks if no better. Will consider PFT's and CXR. Pt voiced understanding and agreement to the plan.  Jilda Rocheicholas Paul [place], DO 06/22/17 1:59 PM

## 2017-09-07 ENCOUNTER — Ambulatory Visit (INDEPENDENT_AMBULATORY_CARE_PROVIDER_SITE_OTHER): Payer: BLUE CROSS/BLUE SHIELD | Admitting: Family Medicine

## 2017-09-07 ENCOUNTER — Encounter: Payer: Self-pay | Admitting: Family Medicine

## 2017-09-07 VITALS — BP 104/80 | HR 77 | Temp 98.9°F | Ht 60.0 in | Wt 130.0 lb

## 2017-09-07 DIAGNOSIS — G8929 Other chronic pain: Secondary | ICD-10-CM

## 2017-09-07 DIAGNOSIS — M25562 Pain in left knee: Secondary | ICD-10-CM | POA: Diagnosis not present

## 2017-09-07 MED ORDER — METHYLPREDNISOLONE ACETATE 40 MG/ML IJ SUSP
40.0000 mg | Freq: Once | INTRAMUSCULAR | Status: AC
  Administered 2017-09-07: 40 mg via INTRAMUSCULAR

## 2017-09-07 NOTE — Progress Notes (Signed)
Musculoskeletal Exam  Patient: Linda Underwood DOB: March 10, 1971  DOS: 09/07/2017  SUBJECTIVE:  Chief Complaint:   Chief Complaint  Patient presents with  . Knee Pain    left    Linda Underwood is a 47 y.o.  female for evaluation and treatment of L knee pain.   Onset:  1 yr ago. Hx of meniscal issue, she refused surg and did well with injection, requesting one today. Mild swelling, no bruising, catching/locking.  ROS: Musculoskeletal/Extremities: +L knee pain  Past Medical History:  Diagnosis Date  . Bipolar 2 disorder (HCC)     Objective: VITAL SIGNS: BP 104/80 (BP Location: Left Arm, Patient Position: Sitting, Cuff Size: Normal)   Pulse 77   Temp 98.9 F (37.2 C) (Oral)   Ht 5' (1.524 m)   Wt 130 lb (59 kg)   SpO2 98%   BMI 25.39 kg/m  Constitutional: Well formed, well developed. No acute distress. Cardiovascular: Brisk cap refill Thorax & Lungs: No accessory muscle use Musculoskeletal: L knee.   Normal active range of motion: yes.   Normal passive range of motion: yes Tenderness to palpation: no Deformity: no Ecchymosis: no Tests positive: Stine Tests negative: McMurrays, Lachman, varus/valgus Neurologic: Normal sensory function.  Psychiatric: Normal mood. Age appropriate judgment and insight. Alert & oriented x 3.    Procedure Note; Knee injection Verbal consent obtained. The area of the antero-lateral joint line was palpated and cleaned with alcohol x1. A 27-gauge needle was used to enter the joint space anterolaterally with ease. 40 mg of Depomedrol with 2 mL of 1% lidocaine was injected. The patient tolerated the procedure well. There were no complications noted.  Assessment:  Chronic pain of left knee - Plan: methylPREDNISolone acetate (DEPO-MEDROL) injection 40 mg, PR DRAIN/INJECT LARGE JOINT/BURSA  Plan: Orders as above. F/u prn. The patient voiced understanding and agreement to the plan.   Linda Underwood [place], DO 09/07/17  8:39 AM

## 2017-09-07 NOTE — Patient Instructions (Signed)
Stretching and range of motion exercises These exercises warm up your muscles and joints and improve the movement and flexibility of your knee. These exercises also help to relieve pain and stiffness.  Exercise A: Knee flexion, active 1. Lie on your back with both knees straight. If this causes back discomfort, bend your uninjured knee so your foot is flat on the floor. 2. Slowly slide your left / right heel back toward your buttocks until you feel a gentle stretch in the front of your knee or thigh. Stop if you have pain. 3. Hold for3 seconds. 4. Slowly slide your left / right heel back to the starting position. 10 total repetitions. Repeat 2 times. Complete this exercise 3 times a week.  Exercise B: Knee extension, sitting 1. Sit with your left / right heel propped on a chair, a coffee table, or a footstool. Do not have anything under your knee to support it. 2. Allow your leg muscles to relax, letting gravity straighten out your knee. You should feel a stretch behind your left / right knee. 3. If told by your health care provide just above your kneecap. 4. Hold this position for 3 seconds. 5. Repeat for a total of 10 repetitions. Repeat 2 times. Complete this stretch 3 times a week.  Strengthening exercises These exercises build strength and endurance in your knee. Endurance is the ability to use your muscles for a long time, even after they get tired.  Exercise C: Quadriceps, isometric 1. Lie on your back with your left / right leg extended and your other knee bent. Put a rolled towel or small pillow under your right/left knee if told by your health care provider. 2. Slowly tense the muscles in the front of your left / right thigh by pushing the back of your knee down. You should see your knee cap slide up toward your hip or see increased dimpling just above the knee. 3. For 3 seconds, keep the muscle as tight as you can without increasing your pain. 4. Relax the muscles slowly and  completely. Repeat for 10 total repetitions. Repeat 2 times. Complete this exercise 3 times a week. Exercise D: Straight leg raises (quadriceps) 1. Lie on your back with your left / right leg extended and your other knee bent. 2. Tense the muscles in the front of your left / right thigh. You should see your kneecap slide up or see increased dimpling just above the knee. 3. Keep these muscles tight as you raise your leg 4-6 inches (10-15 cm) off the floor. 4. Hold this position for 3 seconds. 5. Keep these muscles tense as you lower your leg. 6. Relax the muscles slowly and completely. Repeat for a total of 10 repetitions. Repeat 2 times. Complete this exercise 3 times a week.  Exercise E: Hamstring curls 1. On the floor or a bed, lie on your abdomen with your legs straight. Put a folded towel or small pillow under your left / right thigh, just above your kneecap. 2. Slowly bend your left / right knee as far as you can without pain. Keep your hips flat against the floor or bed. 3. Hold this position for 3 seconds. 4. Slowly lower your leg to the starting position. Repeat for a total of 10 repetitions. Repeat 2 times. Complete this exercise 3 times per week.  Stretching exercises These exercises warm up your muscles and joints and improve the movement and flexibility of your knee. These exercises also help to relieve pain and  stiffness.  Exercise A: Quadriceps, prone 1. Lie on your abdomen on a firm surface, such as a bed or padded floor. 2. Bend your left / right knee and hold your ankle. If you cannot reach your ankle or pant leg, loop a belt around your foot and grab the belt instead. 3. Gently pull your heel toward your buttocks. Your knee should not slide out to the side. You should feel a stretch in the front of your thigh and knee. 4. Hold this position for 30 seconds. Repeat 2 times. Complete this stretch 3 times a week.  Exercise B: Hamstring, doorway 1. Lie on your back in front  of a doorway with your left / right leg resting against the wall and your other leg flat on the floor in the doorway. There should be a slight bend in your left / right knee. 2. Straighten your left / right knee. You should feel a stretch behind your knee or thigh. If you do not feel that stretch, scoot your buttocks closer to the door. 3. Hold this position for 30 seconds. Repeat 2 times. Complete this stretch 3 times a week.  Strengthening exercises These exercises build strength and endurance in your knee and leg muscles. Endurance is the ability to use your muscles for a long time, even after they get tired.   Exercise D: Wall slides (quadriceps) 1. Lean your back against a smooth wall or door, and walk your feet out 18-24 inches (45-61 cm) from it. 2. Place your feet hip-width apart. 3. Slowly slide down the wall or door until your knees bend 90 degrees. Keep your knees over your heels, not over your toes. Keep your knees in line with your hips. 4. Hold for 2 seconds. 5. Stand up to rest for 60 seconds. Repeat 2 times. Complete this exercise 3 times a week.  Exercise E: Bridge (hip extensors) 1. Lie on your back on a firm surface with your knees bent and your feet flat on the floor. 2. Tighten your buttocks muscles and lift your bottom off the floor until your trunk is level with your thighs. ? Do not arch your back. ? You should feel the muscles working in your buttocks and the back of your thighs. 3. Hold this position for 2 seconds. 4. Slowly lower your hips to the starting position. 5. Let your buttocks muscles relax completely between repetitions. Repeat 2 times. Complete this exercise 3 times a week.

## 2017-09-07 NOTE — Progress Notes (Signed)
Pre visit review using our clinic review tool, if applicable. No additional management support is needed unless otherwise documented below in the visit note. 

## 2017-09-23 ENCOUNTER — Encounter: Payer: Self-pay | Admitting: Obstetrics & Gynecology

## 2017-12-17 ENCOUNTER — Encounter: Payer: Self-pay | Admitting: Family Medicine

## 2017-12-23 ENCOUNTER — Ambulatory Visit (INDEPENDENT_AMBULATORY_CARE_PROVIDER_SITE_OTHER): Payer: BLUE CROSS/BLUE SHIELD | Admitting: Family Medicine

## 2017-12-23 ENCOUNTER — Encounter: Payer: Self-pay | Admitting: Family Medicine

## 2017-12-23 VITALS — BP 98/70 | HR 91 | Temp 98.5°F | Ht 61.0 in | Wt 131.4 lb

## 2017-12-23 DIAGNOSIS — R61 Generalized hyperhidrosis: Secondary | ICD-10-CM

## 2017-12-23 LAB — COMPREHENSIVE METABOLIC PANEL
ALK PHOS: 42 U/L (ref 39–117)
ALT: 10 U/L (ref 0–35)
AST: 14 U/L (ref 0–37)
Albumin: 4.3 g/dL (ref 3.5–5.2)
BILIRUBIN TOTAL: 0.6 mg/dL (ref 0.2–1.2)
BUN: 13 mg/dL (ref 6–23)
CALCIUM: 9.6 mg/dL (ref 8.4–10.5)
CO2: 30 mEq/L (ref 19–32)
Chloride: 103 mEq/L (ref 96–112)
Creatinine, Ser: 0.84 mg/dL (ref 0.40–1.20)
GFR: 77.03 mL/min (ref >60.00)
GLUCOSE: 85 mg/dL (ref 70–99)
POTASSIUM: 4.8 meq/L (ref 3.5–5.1)
Sodium: 139 mEq/L (ref 135–145)
TOTAL PROTEIN: 6.5 g/dL (ref 6.0–8.3)

## 2017-12-23 LAB — HEMOGLOBIN A1C: Hgb A1c MFr Bld: 5.7 % (ref 4.6–6.5)

## 2017-12-23 LAB — CBC
HEMATOCRIT: 38.3 % (ref 36.0–46.0)
HEMOGLOBIN: 12.7 g/dL (ref 12.0–15.0)
MCHC: 33.1 g/dL (ref 30.0–36.0)
MCV: 79.4 fl (ref 78.0–100.0)
PLATELETS: 323 10*3/uL (ref 150.0–400.0)
RBC: 4.82 Mil/uL (ref 3.87–5.11)
RDW: 12.7 % (ref 11.5–15.5)
WBC: 4.8 10*3/uL (ref 4.0–10.5)

## 2017-12-23 LAB — T4, FREE: Free T4: 1.04 ng/dL (ref 0.60–1.60)

## 2017-12-23 LAB — TSH: TSH: 2.18 u[IU]/mL (ref 0.35–4.50)

## 2017-12-23 NOTE — Progress Notes (Signed)
Pre visit review using our clinic review tool, if applicable. No additional management support is needed unless otherwise documented below in the visit note. 

## 2017-12-23 NOTE — Progress Notes (Signed)
Chief Complaint  Patient presents with  . Excessive Sweating  . Weight Gain    Subjective: Patient is a 47 y.o. female here for bloating and night sweats.  This is been going on for 3 weeks.  On the nightly basis, she will wake up sweating and extremely hot.  She is tried turning down the air conditioning, fans, and clothing/bedding adjustments.  Everyone else in the house is very cold.  Her mother went through menopause age 10955.  Her last period was a little over a month now.  She denies any fevers or recent illness.  She does have a history of reflux, however it is not associated with burning in her chest or discomfort after meals.  She does not have a personal history of diabetes, but there is a family history.  No recent medication changes.  Denies any upper respiratory issues or unintentional weight loss.  No recent travel, patient does not have a known diagnosis of tuberculosis.    ROS: Const: No fevers Lungs: Denies SOB   Past Medical History:  Diagnosis Date  . Bipolar 2 disorder (HCC)     Objective: BP 98/70 (BP Location: Left Arm, Patient Position: Sitting, Cuff Size: Normal)   Pulse 91   Temp 98.5 F (36.9 C) (Oral)   Ht 5\' 1"  (1.549 m)   Wt 131 lb 6 oz (59.6 kg)   SpO2 98%   BMI 24.82 kg/m  General: Awake, appears stated age HEENT: MMM, EOMi Heart: RRR, no murmurs Lungs: CTAB, no rales, wheezes or rhonchi. No accessory muscle use Abdomen: Bowel sounds present, soft, nontender, nondistended, no masses or organomegaly Psych: Age appropriate judgment and insight, normal affect and mood  Assessment and Plan: Night sweats - Plan: CBC, Comprehensive metabolic panel, Hemoglobin A1c, TSH, QuantiFERON-TB Gold Plus, T4, free  Orders as above.  If no improvement, will trial Neurontin at night.  Could be perimenopausal symptoms.  If that still persisted, would refer to endocrinology.  Follow-up as needed at this time for this issue. The patient voiced understanding and  agreement to the plan.  Jilda Rocheicholas Paul [place], DO 12/23/17  11:57 AM

## 2017-12-23 NOTE — Patient Instructions (Signed)
Give us 2-3 business days to get the results of your labs back.   Continue wearing appropriate clothing and use of fans.  Consider elevating head of bed and don't eat before bed.   Let us know if you need anything.

## 2017-12-26 LAB — QUANTIFERON-TB GOLD PLUS
Mitogen-NIL: >10 IU/mL
NIL: 0.47 IU/mL
QuantiFERON-TB Gold Plus: POSITIVE — AB
TB1-NIL: 0.97 IU/mL
TB2-NIL: 0.99 [IU]/mL

## 2017-12-29 ENCOUNTER — Other Ambulatory Visit: Payer: Self-pay | Admitting: Family Medicine

## 2017-12-29 DIAGNOSIS — R7612 Nonspecific reaction to cell mediated immunity measurement of gamma interferon antigen response without active tuberculosis: Secondary | ICD-10-CM

## 2018-01-01 ENCOUNTER — Ambulatory Visit (HOSPITAL_BASED_OUTPATIENT_CLINIC_OR_DEPARTMENT_OTHER)
Admission: RE | Admit: 2018-01-01 | Discharge: 2018-01-01 | Disposition: A | Discharge status: Home / Self Care | Payer: BLUE CROSS/BLUE SHIELD | Source: Ambulatory Visit | Attending: Family Medicine | Admitting: Family Medicine

## 2018-01-01 DIAGNOSIS — R7612 Nonspecific reaction to cell mediated immunity measurement of gamma interferon antigen response without active tuberculosis: Secondary | ICD-10-CM | POA: Diagnosis not present

## 2018-01-01 IMAGING — CR DG CHEST 1V
1 series · 1 of 1 positions shown · non-contrast
Comparison: [DATE]

CLINICAL DATA: Positive TB gold test

EXAM:
CHEST  1 VIEW

[w chest pa]
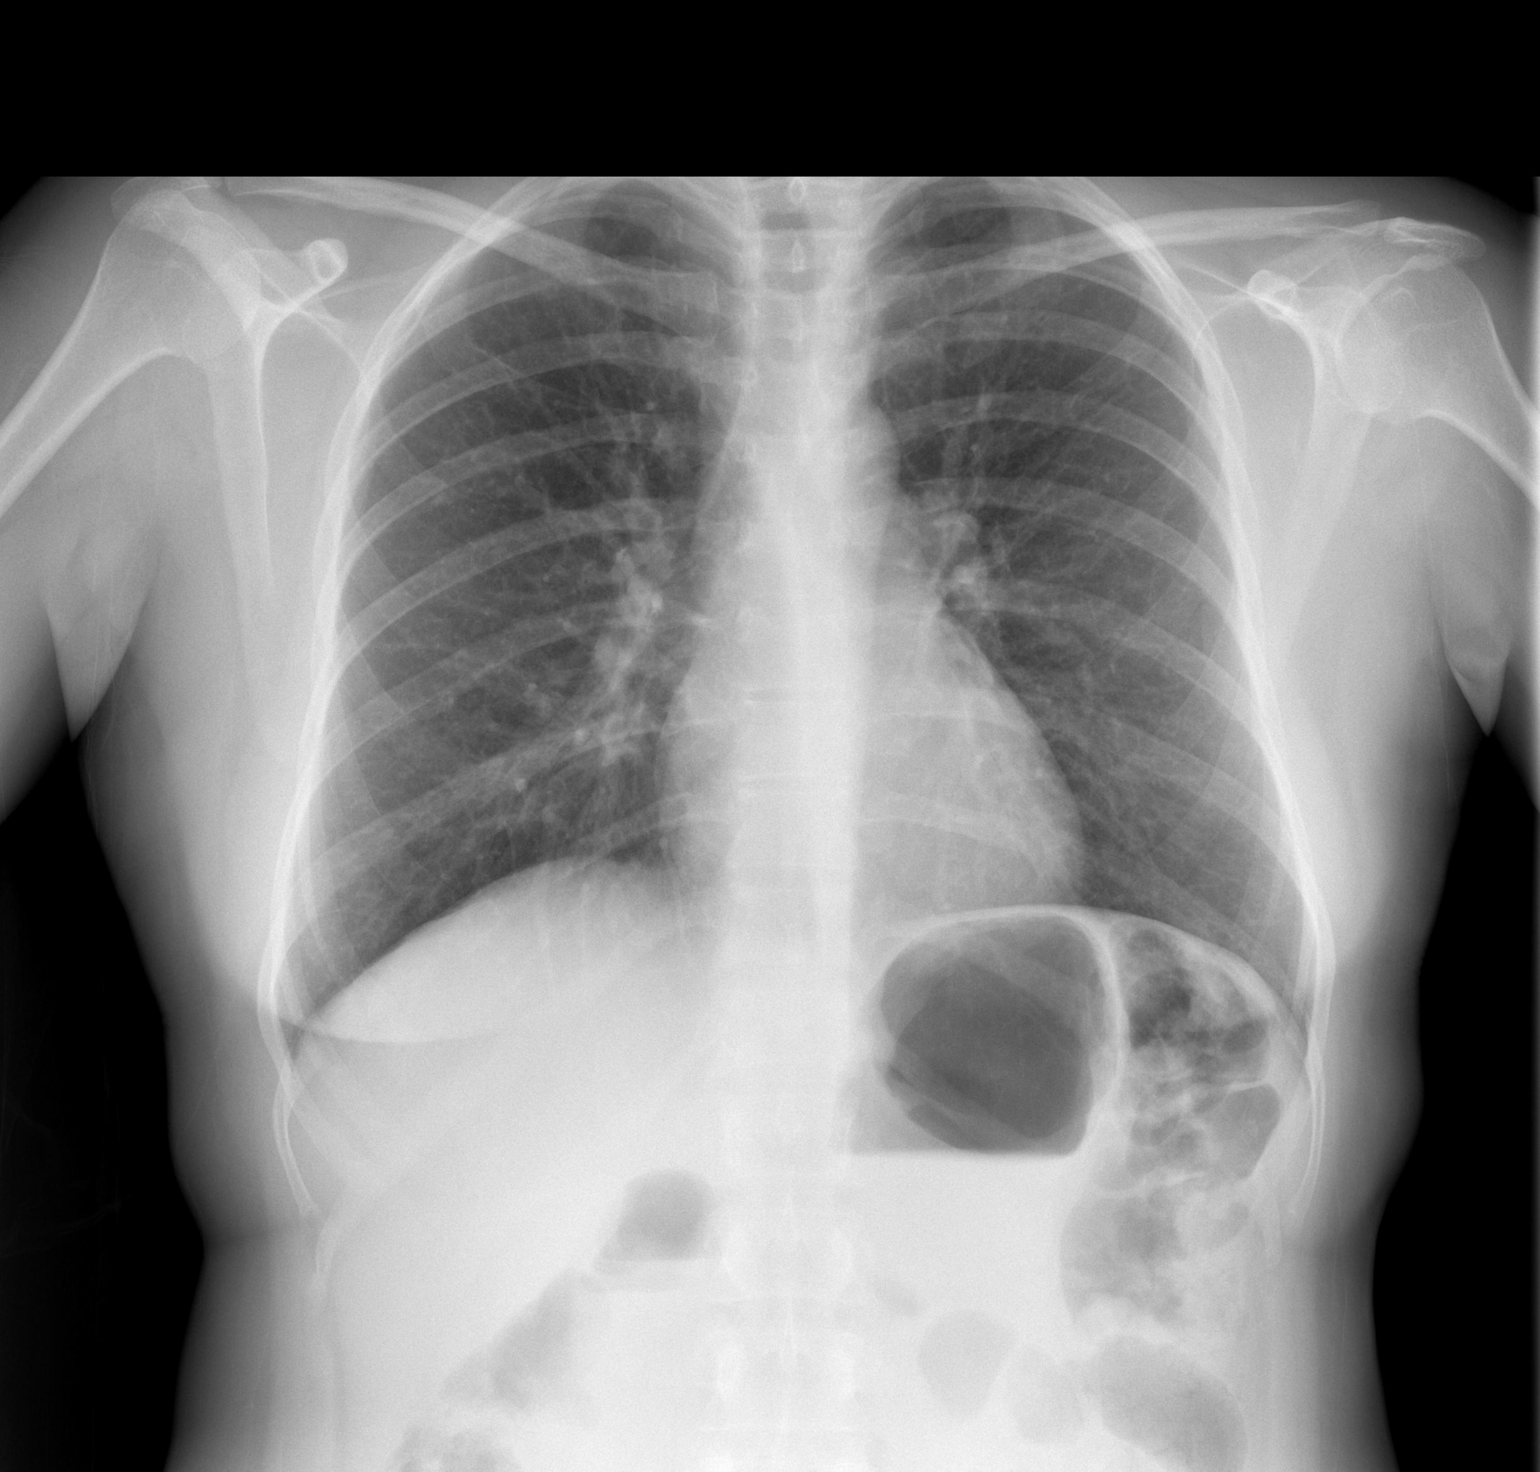

[1 of 1 positions shown; findings below may reference images not displayed]

FINDINGS: Heart and mediastinal contours are within normal limits. No focal
opacities or effusions. No acute bony abnormality.
IMPRESSION: No active cardiopulmonary disease.  No evidence of tuberculosis.

## 2018-02-12 ENCOUNTER — Ambulatory Visit (INDEPENDENT_AMBULATORY_CARE_PROVIDER_SITE_OTHER): Payer: BLUE CROSS/BLUE SHIELD | Admitting: Family Medicine

## 2018-02-12 ENCOUNTER — Ambulatory Visit: Payer: Self-pay | Admitting: Plastic Surgery

## 2018-02-12 ENCOUNTER — Encounter: Payer: Self-pay | Admitting: Family Medicine

## 2018-02-12 ENCOUNTER — Ambulatory Visit: Payer: BLUE CROSS/BLUE SHIELD | Admitting: Family Medicine

## 2018-02-12 ENCOUNTER — Encounter: Payer: Self-pay | Admitting: Plastic Surgery

## 2018-02-12 VITALS — BP 102/62 | HR 72 | Temp 98.4°F | Ht 61.0 in | Wt 133.4 lb

## 2018-02-12 VITALS — BP 98/60 | HR 70 | Resp 12 | Ht 60.0 in | Wt 132.0 lb

## 2018-02-12 DIAGNOSIS — Z719 Counseling, unspecified: Secondary | ICD-10-CM | POA: Insufficient documentation

## 2018-02-12 DIAGNOSIS — M25511 Pain in right shoulder: Secondary | ICD-10-CM

## 2018-02-12 DIAGNOSIS — Z09 Encounter for follow-up examination after completed treatment for conditions other than malignant neoplasm: Secondary | ICD-10-CM | POA: Diagnosis not present

## 2018-02-12 DIAGNOSIS — L601 Onycholysis: Secondary | ICD-10-CM | POA: Insufficient documentation

## 2018-02-12 HISTORY — DX: Counseling, unspecified: Z71.9

## 2018-02-12 HISTORY — DX: Onycholysis: L60.1

## 2018-02-12 MED ORDER — PREDNISONE 20 MG PO TABS: 40.0000 mg | ORAL_TABLET | Freq: Every day | ORAL | 0 refills | Status: AC

## 2018-02-12 NOTE — Addendum Note (Signed)
Addended by: Scharlene Gloss B on: 02/12/2018 02:49 PM   Modules accepted: Orders

## 2018-02-12 NOTE — Progress Notes (Addendum)
Botulinum Toxin Procedure Note  Procedure: Cosmetic botulinum toxin   Pre-operative Diagnosis: Dynamic rhytides   Post-operative Diagnosis: Same  Complications:  None  Brief history: The patient desires botulinum toxin injection of her forehead. I discussed with the patient this proposed procedure of botulinum toxin injections, which is customized depending on the particular needs of the patient. It is performed on facial rhytids as a temporary correction. The alternatives were discussed with the patient. The risks were addressed including bleeding, scarring, infection, damage to deeper structures, asymmetry, and chronic pain, which may occur infrequently after a procedure. The individual's choice to undergo a surgical procedure is based on the comparison of risks to potential benefits. Other risks include unsatisfactory results, brow ptosis, eyelid ptosis, allergic reaction, temporary paralysis, which should go away with time, bruising, blurring disturbances and delayed healing. Botulinum toxin injections do not arrest the aging process or produce permanent tightening of the eyelid.  Operative intervention maybe necessary to maintain the results of a blepharoplasty or botulinum toxin. The patient understands and wishes to proceed. An informed consent was signed and informational brochures given to her prior to the procedure.  Procedure: The area was prepped with alcohol and dried with a clean gauze. Using a clean technique, the botulinum toxin was diluted with 1.25 cc of preservative-free normal saline which was slowly injected with an 18 gauge needle in a tuberculin syringes.  A 32 gauge needles were then used to inject the botulinum toxin. This mixture allow for an aliquot of 5 units per 0.1 cc in each injection site.    Subsequently the mixture was injected in the glabellar and forehead area with preservation of the temporal branch to the lateral eyebrow as well as into each lateral canthal area  beginning from the lateral orbital rim medial to the zygomaticus major in 3 separate areas. A total of 30 Units of botulinum toxin was used. The forehead and glabellar area was injected with care to inject intramuscular only while holding pressure on the supratrochlear vessels in each area during each injection on either side of the medial corrugators. The injection proceeded vertically superiorly to the medial 2/3 of the frontalis muscle and superior 2/3 of the lateral frontalis, again with preservation of the frontal branch.  No complications were noted. Light pressure was held for 5 minutes. She was instructed explicitly in post-operative care.  Botox LOT:  [id number] C2 EXP:  07/2020

## 2018-02-12 NOTE — Progress Notes (Signed)
Musculoskeletal Exam  Patient: Linda Underwood DOB: 11/17/1970  DOS: 02/12/2018  SUBJECTIVE:  Chief Complaint:   Chief Complaint  Patient presents with  . Shoulder Pain    right    Linda Underwood is a 47 y.o.  female for evaluation and treatment of R shoulder pain.   Onset:  2 weeks ago. Felt a pull when she pulled a rope tight.  Location: R shoulder Character:  aching  Progression of issue:  is unchanged Associated symptoms: decreased ROM Treatment: to date has been OTC NSAIDS, Icy Hot.   Neurovascular symptoms: no  Was worked up for night sweats. Several weeks after workup, stopped having periods. Less freq now.  Has had nail on L ring finger that is starting to separate. Wondering if fungal? Has not used anything at home. No hx of nutrient deficiency.  ROS: Musculoskeletal/Extremities: +R shoulder pain Endo: +nightsweats  Past Medical History:  Diagnosis Date  . Bipolar 2 disorder (HCC)     Objective: VITAL SIGNS: BP 102/62 (BP Location: Left Arm, Patient Position: Sitting, Cuff Size: Normal)   Pulse 72   Temp 98.4 F (36.9 C) (Oral)   Ht 5\' 1"  (1.549 m)   Wt 133 lb 6 oz (60.5 kg)   SpO2 97%   BMI 25.20 kg/m  Constitutional: Well formed, well developed. No acute distress. Cardiovascular: Brisk cap refill Skin: 4th digit on L, there is onycholysis and thickening of the lateral portion of the nail plate Thorax & Lungs: No accessory muscle use Musculoskeletal: R shoulder.   Normal active range of motion: no.   Normal passive range of motion: no Tenderness to palpation: yes over prox biceps tendon Deformity: no Ecchymosis: no Tests positive: Speed's, Yergason's Tests negative: Neer's, Empty can, lift off, cross over, Hawkins Grip strength adequate. Neurologic: Normal sensory function. No focal deficits noted. DTR's equal and symmetry in UE's. No clonus. Psychiatric: Normal mood. Age appropriate judgment and insight. Alert & oriented x 3.    Assessment:  Acute  pain of right shoulder - Plan: predniSONE (DELTASONE) 20 MG tablet  Onycholysis  Follow-up for resolved condition  Plan: Orders as above. Ice, heat, stretches/exercises, steroid, Tylenol. Fungal cx.  Night sweats likely 2/2 perimenopause.  F/u prn. The patient voiced understanding and agreement to the plan.   Linda Roche [place], DO 02/12/18  2:35 PM

## 2018-02-12 NOTE — Progress Notes (Signed)
Pre visit review using our clinic review tool, if applicable. No additional management support is needed unless otherwise documented below in the visit note. 

## 2018-02-12 NOTE — Patient Instructions (Addendum)
Ice/cold pack over area for 10-15 min twice daily.  Heat (pad or rice pillow in microwave) over affected area, 10-15 minutes twice daily.   OK to take Tylenol 1000 mg (2 extra strength tabs) or 975 mg (3 regular strength tabs) every 6 hours as needed.  No NSAIDs while on steroid.  If no improvement over next 2-4 weeks, let us know.  We will be in touch regarding your nail culture. Consider a multi-vitamin in the meanwhile.   Biceps Tendon Disruption (Proximal) Rehab Do exercises exactly as told by your health care provider and adjust them as directed. It is normal to feel mild stretching, pulling, tightness, or discomfort as you do these exercises, but you should stop right away if you feel sudden pain or your pain gets worse. Stretching and range of motion exercises These exercises warm up your muscles and joints and improve the movement and flexibility of your arm and shoulder. These exercises also help to relieve pain and stiffness. Exercise A: Shoulder flexion, standing   1. Stand facing a wall. Put your left / right hand on the wall. 2. Slide your left / right hand up the wall. Stop when you feel a stretch in your shoulder, or when you reach the angle recommended by your health care provider.  Use your other hand to help raise your arm, if needed.  As your hand gets higher, you may need to step closer to the wall.  Avoid shrugging your shoulder while you raise your arm. To do this, keep your shoulder blade tucked down toward your spine. 3. Hold for 30 seconds. 4. Slowly return to the starting position. Use your other arm to help, if needed. Repeat 2 times. Complete this exercise 3 times per week. Exercise B: Pendulum   1. Stand near a wall or a surface that you can hold onto for balance. 2. Bend at the waist and let your left / right arm hang straight down. Use your other arm to support you. 3. Relax your arm and shoulder muscles, and move your hips and your trunk so your left  / right arm swings freely. Your arm should swing because of the motion of your body, not because you are using your arm or shoulder muscles. 4. Keep moving so your arm swings in the following directions, as told by your health care provider:  Side to side.  Forward and backward.  In clockwise and counterclockwise circles. 5. Slowly return to the starting position. Repeat 2 times. Complete this exercise 3 times per week.  Strengthening exercises These exercises build strength and endurance in your arm and shoulder. Endurance is the ability to use your muscles for a long time, even after your muscles get tired. Exercise C: Elbow flexion, neutral  1. Sit on a stable chair without armrests, or stand. 2. Hold a 3-5 lb weight in your left / right hand, or hold an exercise band with both hands. Your palms should face each other at the starting position. 3. Bend your left / right elbow and move your hand up toward your shoulder.  Lead with your thumb, and keep your palm facing the same direction.  Keep your other arm straight down, in the starting position. 4. Slowly return to the starting position. Repeat 2-3 times. Complete this exercise 3 times per week. Exercise D: Forearm supination   1. Sit with your left / right forearm on a table. Your elbow should be below shoulder height. Rest your hand over the edge of  the table so your palm faces down. 2. If directed, hold a hammer with your left / right hand. 3. Without moving your elbow, slowly rotate your hand so your palm faces up toward the ceiling.  If you are holding a hammer, begin by holding the hammer near the head. When this exercise gets easier for you, hold the hammer farther down the handle. 4. Hold for 3 seconds. 5. Slowly return to the starting position. Repeat 2 times. Complete this exercise 3 times per week. Exercise E: Scapular retraction   1. Sit in a stable chair without armrests, or stand. 2. Secure an exercise band to a  stable object in front of you so the band is at shoulder height. 3. Hold one end of the exercise band in each hand. 4. Squeeze your shoulder blades together and move your elbows slightly behind you. Do not shrug your shoulders. 5. Hold for 3 seconds. 6. Slowly return to the starting position. Repeat 2 times. Complete this exercise 3 times per week. Exercise F: Scapular protraction, supine   1. Lie on your back on a firm surface. Hold a 3-5 lb weight in your left / right hand. 2. Raise your left / right arm straight into the air so your hand is directly above your shoulder joint. 3. Push the weight into the air so your shoulder lifts off of the surface that you are lying on. Do not move your head, neck, or back. 4. Hold for 3 seconds. 5. Slowly return to the starting position. Let your muscles relax completely before you repeat this exercise. Repeat 2 times. Complete this exercise 3 times per week. This information is not intended to replace advice given to you by your health care provider. Make sure you discuss any questions you have with your health care provider. Document Released: 04/28/2005 Document Revised: 01/03/2016 Document Reviewed: 04/06/2015 Elsevier Interactive Patient Education  2017 [employer].

## 2018-03-14 LAB — CULT, FUNGUS, SKIN,HAIR,NAIL W/KOH
MICRO NUMBER: 91195815
SMEAR:: NONE SEEN
SPECIMEN QUALITY:: ADEQUATE

## 2018-07-15 DIAGNOSIS — E289 Ovarian dysfunction, unspecified: Secondary | ICD-10-CM

## 2018-07-15 HISTORY — DX: Ovarian dysfunction, unspecified: E28.9

## 2018-08-05 ENCOUNTER — Other Ambulatory Visit: Payer: Self-pay

## 2018-08-05 ENCOUNTER — Emergency Department (INDEPENDENT_AMBULATORY_CARE_PROVIDER_SITE_OTHER)
Admission: EM | Admit: 2018-08-05 | Discharge: 2018-08-05 | Disposition: A | Discharge status: Home / Self Care | Payer: BLUE CROSS/BLUE SHIELD | Source: Home / Self Care

## 2018-08-05 DIAGNOSIS — K047 Periapical abscess without sinus: Secondary | ICD-10-CM | POA: Diagnosis not present

## 2018-08-05 MED ORDER — AMOXICILLIN-POT CLAVULANATE 875-125 MG PO TABS: 1.0000 | ORAL_TABLET | Freq: Two times a day (BID) | ORAL | 0 refills | Status: DC

## 2018-08-05 MED ORDER — CHLORHEXIDINE GLUCONATE 0.12% ORAL RINSE (MEDLINE KIT): 15.0000 mL | Freq: Two times a day (BID) | OROMUCOSAL | 0 refills | Status: DC

## 2018-08-05 NOTE — ED Provider Notes (Signed)
[facility]    CSN: [id number] Arrival date & time: 08/05/18  1519     History   Chief Complaint Chief Complaint  Patient presents with  . Dental Pain    HPI Linda Underwood is a 48 y.o. female.   HPI  Linda Underwood is a 48 y.o. female presenting to UC with c/o gradually worsening Left lower tooth pain for 2 days. She has noticed worsening swelling, bleeding and drainage of pus around the sore tooth.  She called several dentist but cannot be seen until the end of the month. They advised her to be evaluated in an urgent care. Denies fever, chills, n/v/d.    Past Medical History:  Diagnosis Date  . Bipolar 2 disorder [facility])     Patient Active Problem List   Diagnosis Date Noted  . Encounter for counseling 02/12/2018  . Onycholysis 02/12/2018    Past Surgical History:  Procedure Laterality Date  . NO PAST SURGERIES      OB History   No obstetric history on file.      Home Medications    Prior to Admission medications   Medication Sig Start Date End Date Taking? Authorizing Provider  Albuterol Sulfate 108 (90 Base) MCG/ACT AEPB Inhale 2 puffs into the lungs every 6 (six) hours as needed for up to 14 days (shortness of breath, cough, wheezing). 06/22/17 07/06/17  Shelda Pal, DO  amoxicillin-clavulanate (AUGMENTIN) 875-125 MG tablet Take 1 tablet by mouth 2 (two) times daily. One po bid x 7 days 08/05/18   Noe Gens, PA-C  beclomethasone (QVAR) 40 MCG/ACT inhaler Inhale 2 puffs into the lungs 2 (two) times daily. 06/22/17   Shelda Pal, DO  chlorhexidine gluconate, MEDLINE KIT, (PERIDEX) 0.12 % solution Use as directed 15 mLs in the mouth or throat 2 (two) times daily. 08/05/18   Noe Gens, PA-C  clonazePAM (KLONOPIN) 1 MG tablet Take 1 mg by mouth daily.    [provider]  fluticasone (FLONASE) 50 MCG/ACT nasal spray Place 2 sprays into both nostrils daily. 12/31/16   Shelda Pal, DO  lamoTRIgine (LAMICTAL) 150  MG tablet Take 150 mg by mouth daily.    [provider]    Family History History reviewed. No pertinent family history.  Social History Social History   Tobacco Use  . Smoking status: Former Smoker    Types: Cigarettes  . Smokeless tobacco: Never Used  Substance Use Topics  . Alcohol use: No  . Drug use: No     Allergies   Patient has no known allergies.   Review of Systems Review of Systems  Constitutional: Negative for chills and fever.  HENT: Positive for dental problem. Negative for sore throat and trouble swallowing.      Physical Exam Triage Vital Signs ED Triage Vitals  Enc Vitals Group     BP 08/05/18 1534 93/66     Pulse Rate 08/05/18 1534 82     Resp 08/05/18 1534 16     Temp 08/05/18 1534 98.7 F (37.1 C)     Temp Source 08/05/18 1534 Oral     SpO2 08/05/18 1534 99 %     Weight --      Height --      Head Circumference --      Peak Flow --      Pain Score 08/05/18 1535 5     Pain Loc --      Pain Edu? --  Excl. in GC? --    No data found.  Updated Vital Signs BP 93/66 (BP Location: Right Arm)   Pulse 82   Temp 98.7 F (37.1 C) (Oral)   Resp 16   LMP 07/16/2018 (Approximate)   SpO2 99%   Visual Acuity Right Eye Distance:   Left Eye Distance:   Bilateral Distance:    Right Eye Near:   Left Eye Near:    Bilateral Near:     Physical Exam Vitals signs and nursing note reviewed.  Constitutional:      Appearance: Normal appearance. She is well-developed.  HENT:     Head: Normocephalic and atraumatic.     Right Ear: Tympanic membrane normal.     Left Ear: Tympanic membrane normal.     Nose: Nose normal.     Right Sinus: No maxillary sinus tenderness or frontal sinus tenderness.     Left Sinus: No maxillary sinus tenderness or frontal sinus tenderness.     Mouth/Throat:     Lips: Pink.     Mouth: Mucous membranes are moist.     Dentition: Dental tenderness and dental abscesses present.     Pharynx: Oropharynx is  clear. Uvula midline.   Neck:     Musculoskeletal: Normal range of motion.  Cardiovascular:     Rate and Rhythm: Normal rate.  Pulmonary:     Effort: Pulmonary effort is normal.  Musculoskeletal: Normal range of motion.  Skin:    General: Skin is warm and dry.  Neurological:     Mental Status: She is alert and oriented to person, place, and time.  Psychiatric:        Behavior: Behavior normal.      UC Treatments / Results  Labs (all labs ordered are listed, but only abnormal results are displayed) Labs Reviewed - No data to display  EKG None  Radiology No results found.  Procedures Procedures (including critical care time)  Medications Ordered in UC Medications - No data to display  Initial Impression / Assessment and Plan / UC Course  I have reviewed the triage vital signs and the nursing notes.  Pertinent labs & imaging results that were available during my care of the patient were reviewed by me and considered in my medical decision making (see chart for details).     Hx and exam c/w dental abscess Will tx with antibiotics  Final Clinical Impressions(s) / UC Diagnoses   Final diagnoses:  Dental abscess     Discharge Instructions      Please take antibiotics as prescribed and be sure to complete entire course even if you start to feel better to ensure infection does not come back.  Please keep your dental appointment and let them know you were prescribed these medications as this may help in their treatment options for you.    ED Prescriptions    Medication Sig Dispense Auth. Provider   amoxicillin-clavulanate (AUGMENTIN) 875-125 MG tablet Take 1 tablet by mouth 2 (two) times daily. One po bid x 7 days 14 tablet Leeroy Cha O, PA-C   chlorhexidine gluconate, MEDLINE KIT, (PERIDEX) 0.12 % solution Use as directed 15 mLs in the mouth or throat 2 (two) times daily. 120 mL Noe Gens, PA-C     Controlled Substance Prescriptions [place] Controlled  Substance Registry consulted? Not Applicable   Tyrell Antonio 08/05/18 1583

## 2018-08-05 NOTE — Discharge Instructions (Signed)
°  Please take antibiotics as prescribed and be sure to complete entire course even if you start to feel better to ensure infection does not come back.  Please keep your dental appointment and let them know you were prescribed these medications as this may help in their treatment options for you.

## 2018-08-05 NOTE — ED Triage Notes (Signed)
Pt c/o tooth pain x 2 days. Located in bottom left of mouth. Some bleeding and foul odor mentioned. Contacted several dentists but they would/could not see her anytime soon.

## 2018-08-25 ENCOUNTER — Encounter: Payer: Self-pay | Admitting: Family Medicine

## 2018-08-25 ENCOUNTER — Other Ambulatory Visit: Payer: Self-pay

## 2018-08-25 ENCOUNTER — Ambulatory Visit (INDEPENDENT_AMBULATORY_CARE_PROVIDER_SITE_OTHER): Payer: BLUE CROSS/BLUE SHIELD | Admitting: Family Medicine

## 2018-08-25 DIAGNOSIS — K047 Periapical abscess without sinus: Secondary | ICD-10-CM

## 2018-08-25 MED ORDER — AMOXICILLIN-POT CLAVULANATE 875-125 MG PO TABS: 1.0000 | ORAL_TABLET | Freq: Two times a day (BID) | ORAL | 0 refills | Status: AC

## 2018-08-25 MED ORDER — NAPROXEN 500 MG PO TABS: 500.0000 mg | ORAL_TABLET | Freq: Two times a day (BID) | ORAL | 0 refills | Status: DC

## 2018-08-25 MED ORDER — TRAMADOL HCL 50 MG PO TABS: 50.0000 mg | ORAL_TABLET | Freq: Two times a day (BID) | ORAL | 0 refills | Status: DC | PRN

## 2018-08-25 NOTE — Progress Notes (Signed)
Chief Complaint  Patient presents with  . Abscess    Linda Underwood is a 48 y.o. female here for an oral complaint. Due to outbreak, we are interacting via web portal for an electronic face-to-face visit. I verified patient's ID using 2 identifiers.   Duration: several days Location: mouth Pruritic? No Painful? Yes Drainage? Yes Other associated symptoms: Augmentin for 4 days, helped and then came back; tried to see a dentist but appt was cancelled due to pandemic. Therapies tried thus far: Augmentin as above, ibuprofen  ROS:  Const: No fevers Skin: As noted in HPI  Past Medical History:  Diagnosis Date  . Bipolar 2 disorder (HCC)    Exam No conversational dyspnea Age appropriate judgment and insight Nml affect and mood  Dental infection - Plan: naproxen (NAPROSYN) 500 MG tablet, amoxicillin-clavulanate (AUGMENTIN) 875-125 MG tablet, traMADol (ULTRAM) 50 MG tablet  Orders as above. Warning signs and symptoms verbalized and written down in AVS. Warnings about tramadol given. F/u prn. The patient voiced understanding and agreement to the plan.  Jilda Roche [place], DO 08/25/18 1:41 PM

## 2018-10-13 ENCOUNTER — Other Ambulatory Visit: Payer: Self-pay | Admitting: Obstetrics & Gynecology

## 2018-10-13 DIAGNOSIS — R928 Other abnormal and inconclusive findings on diagnostic imaging of breast: Secondary | ICD-10-CM

## 2018-10-19 ENCOUNTER — Other Ambulatory Visit: Payer: BLUE CROSS/BLUE SHIELD

## 2018-10-29 ENCOUNTER — Other Ambulatory Visit: Payer: Self-pay

## 2018-10-29 ENCOUNTER — Ambulatory Visit
Admission: RE | Admit: 2018-10-29 | Discharge: 2018-10-29 | Disposition: A | Discharge status: Home / Self Care | Payer: BC Managed Care – PPO | Source: Ambulatory Visit | Attending: Obstetrics & Gynecology | Admitting: Obstetrics & Gynecology

## 2018-10-29 DIAGNOSIS — R928 Other abnormal and inconclusive findings on diagnostic imaging of breast: Secondary | ICD-10-CM

## 2018-10-29 IMAGING — MG DIGITAL DIAGNOSTIC UNILATERAL LEFT MAMMOGRAM WITH TOMO AND CAD
8 series · 8 of 24 positions shown · non-contrast
Comparison: Previous exam(s).

CLINICAL DATA: Screening recall for an asymmetry and a possible
area of distortion in the left breast.

EXAM:
DIGITAL DIAGNOSTIC LEFT MAMMOGRAM WITH CAD AND TOMO
ULTRASOUND LEFT BREAST

[L MLO synth-2D (1 of 2)]
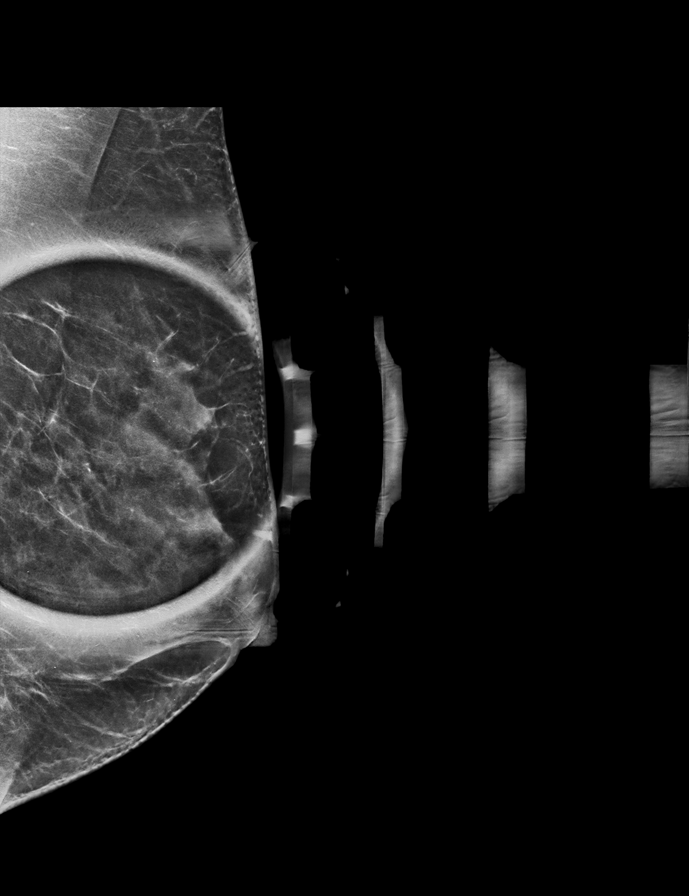

[L CC synth-2D (1 of 2)]
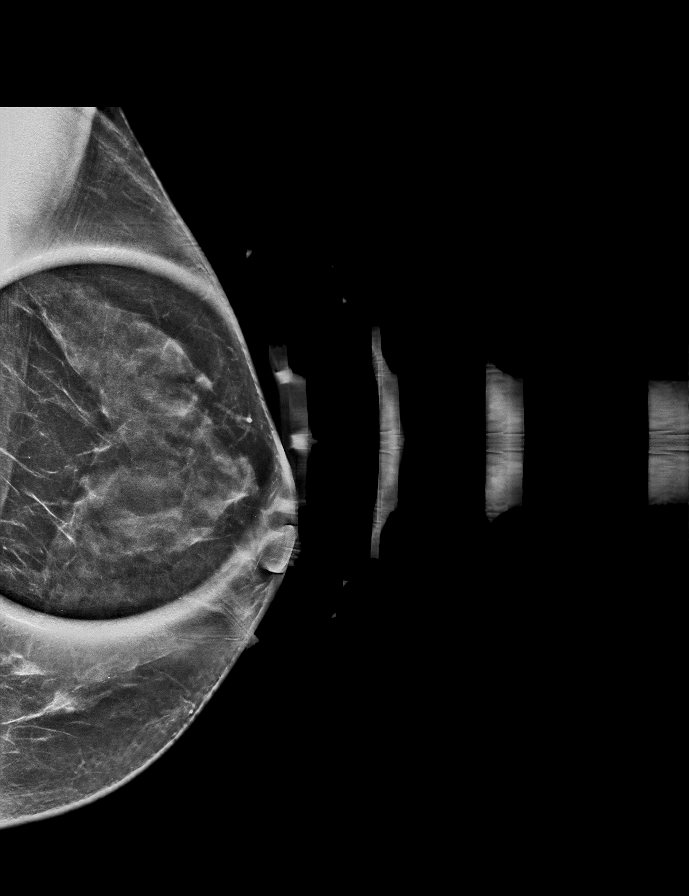

[L CC synth-2D (2 of 2)]
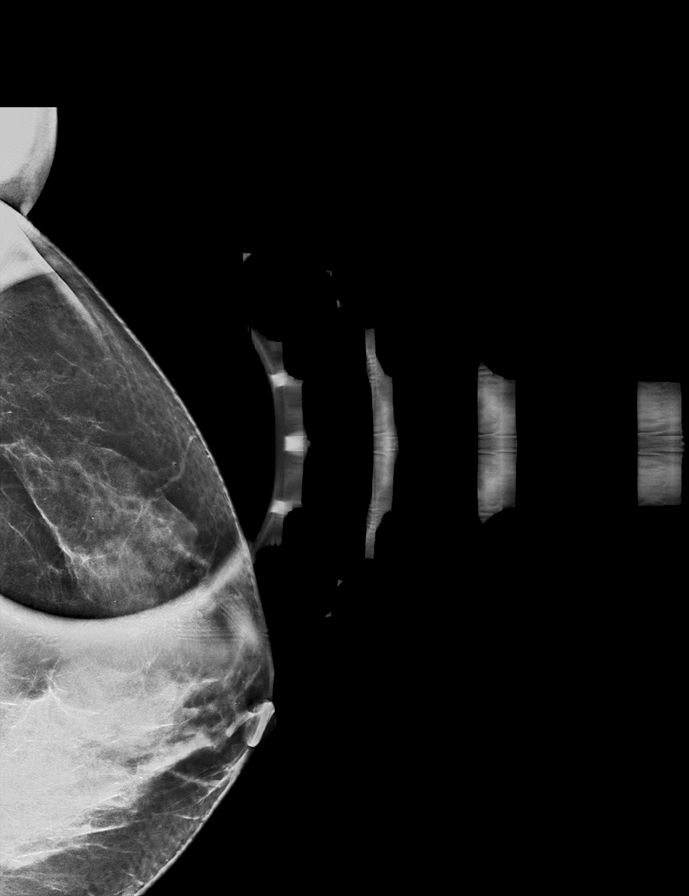

[L MLO synth-2D (2 of 2)]
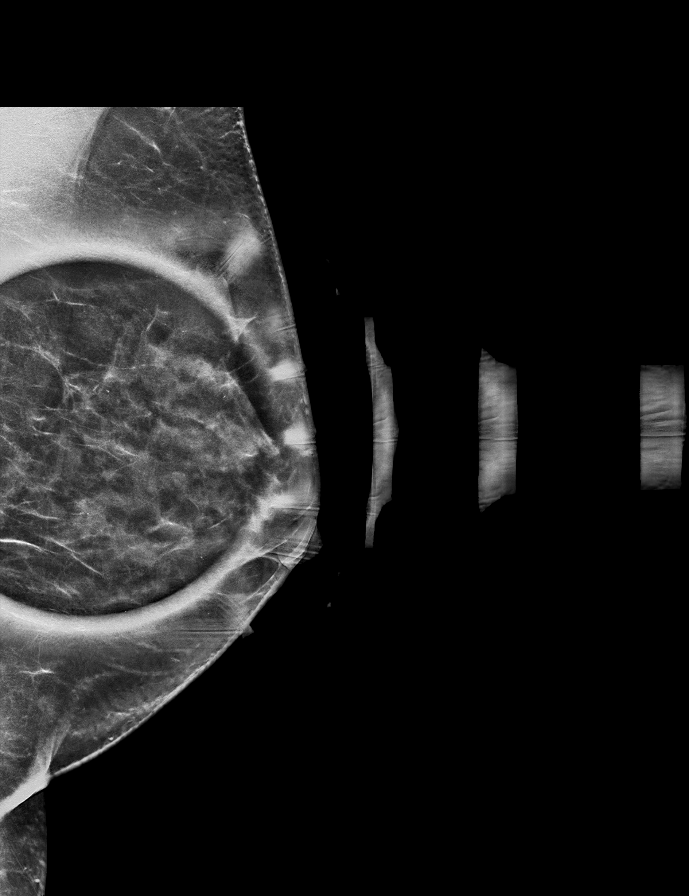

[L MLO tomo (1 of 2) · tomo slice 33/66.0]
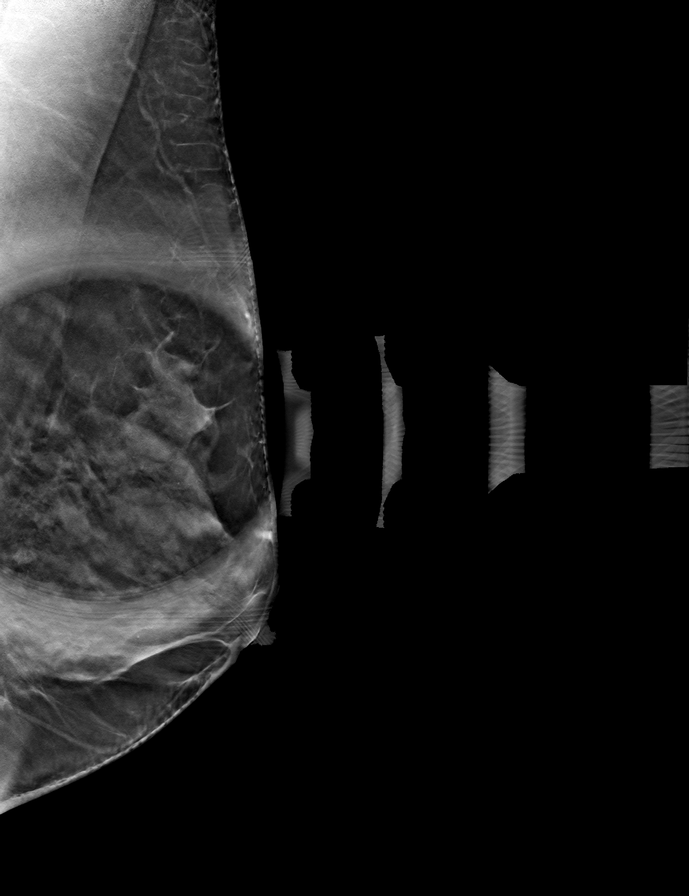

[L CC tomo (1 of 2) · tomo slice 35/68.0]
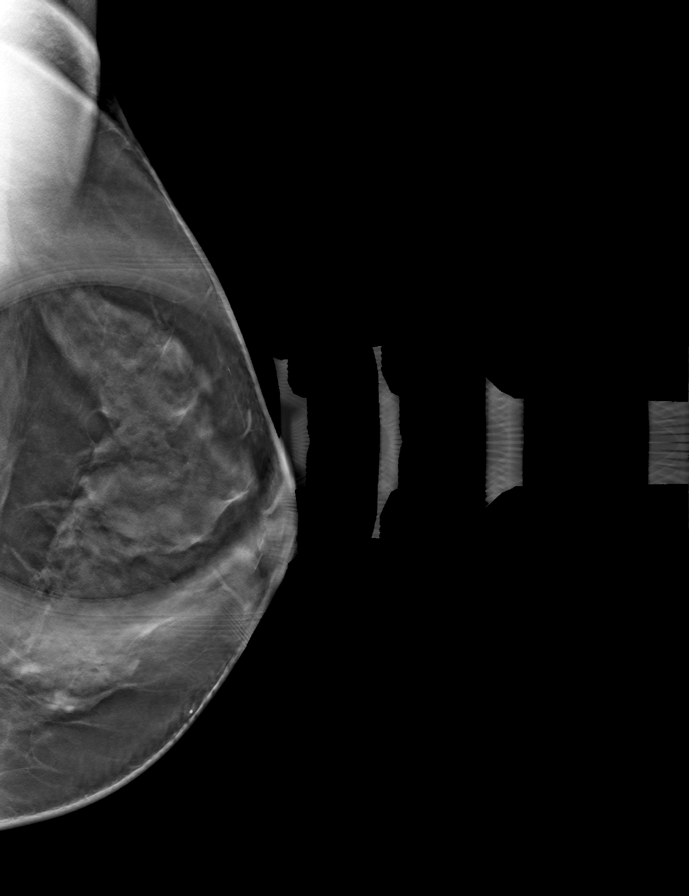

[L CC tomo (2 of 2) · tomo slice 31/60.0]
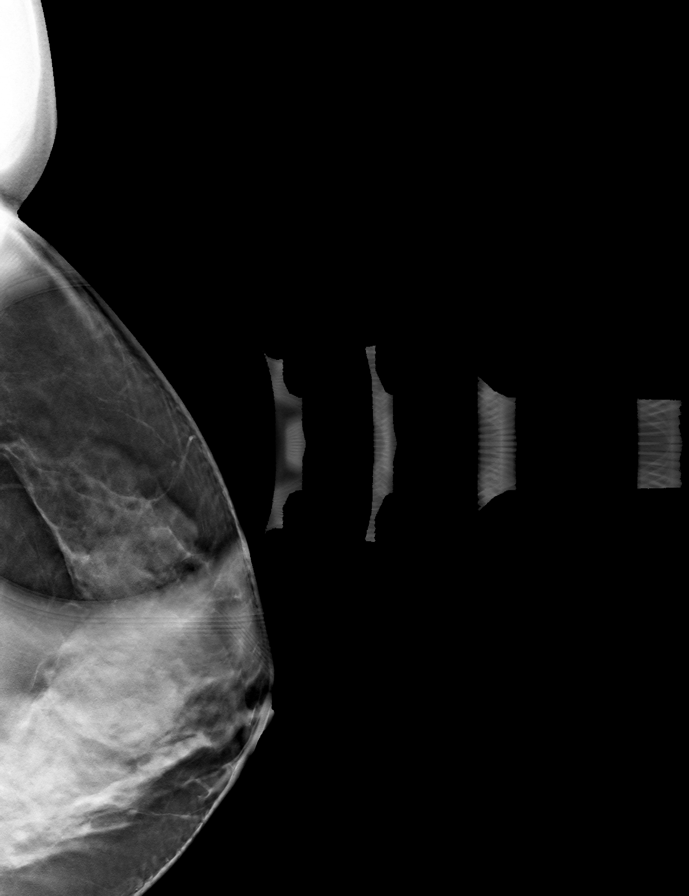

[L MLO tomo (2 of 2) · tomo slice 33/64.0]
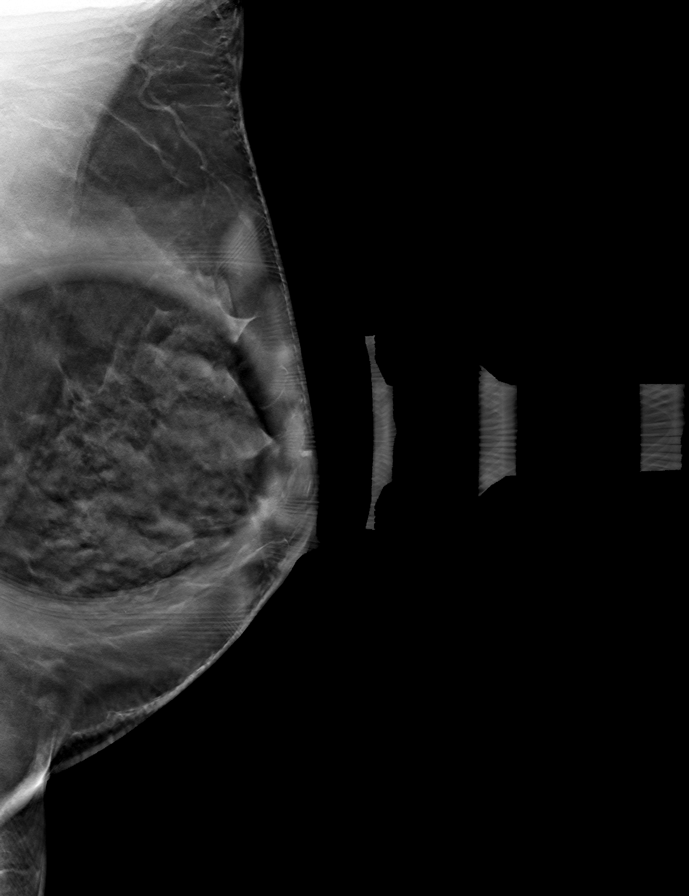

[8 of 24 positions shown; findings below may reference images not displayed]

ACR Breast Density Category d: The breast tissue is extremely dense,
which lowers the sensitivity of mammography.
FINDINGS: Spot compression tomosynthesis images through the questioned
distortion demonstrates resolution of this appearance. No persistent
distortion is identified. In the superior left breast there is an
asymmetry which appears less dense on the spot compression
tomosynthesis images, and somewhat similar to the patient's prior
studies. However, due to the extreme density of the patient's breast
tissue, ultrasound will be performed for further evaluation.

Mammographic images were processed with CAD.

Ultrasound of the superior left breast demonstrates normal
fibroglandular tissue. No suspicious masses or areas of shadowing
are identified.
IMPRESSION: Resolution of the questioned distortion and the asymmetry of the
left breast consistent with overlapping fibroglandular tissue.

RECOMMENDATION:
Screening mammogram in one year.(Code:[AU])

I have discussed the findings and recommendations with the patient.
Results were also provided in writing at the conclusion of the
visit. If applicable, a reminder letter will be sent to the patient
regarding the next appointment.

BI-RADS CATEGORY  1: Negative.

## 2018-10-29 IMAGING — US ULTRASOUND LEFT BREAST LIMITED
1 series · 3 of 3 positions shown · non-contrast
Comparison: Previous exam(s).

CLINICAL DATA: Screening recall for an asymmetry and a possible
area of distortion in the left breast.

EXAM:
DIGITAL DIAGNOSTIC LEFT MAMMOGRAM WITH CAD AND TOMO
ULTRASOUND LEFT BREAST

[Series 1: ultrasound left breast limited · 0.06mm/px · 3 of 3 slices shown]
[im 1/3]
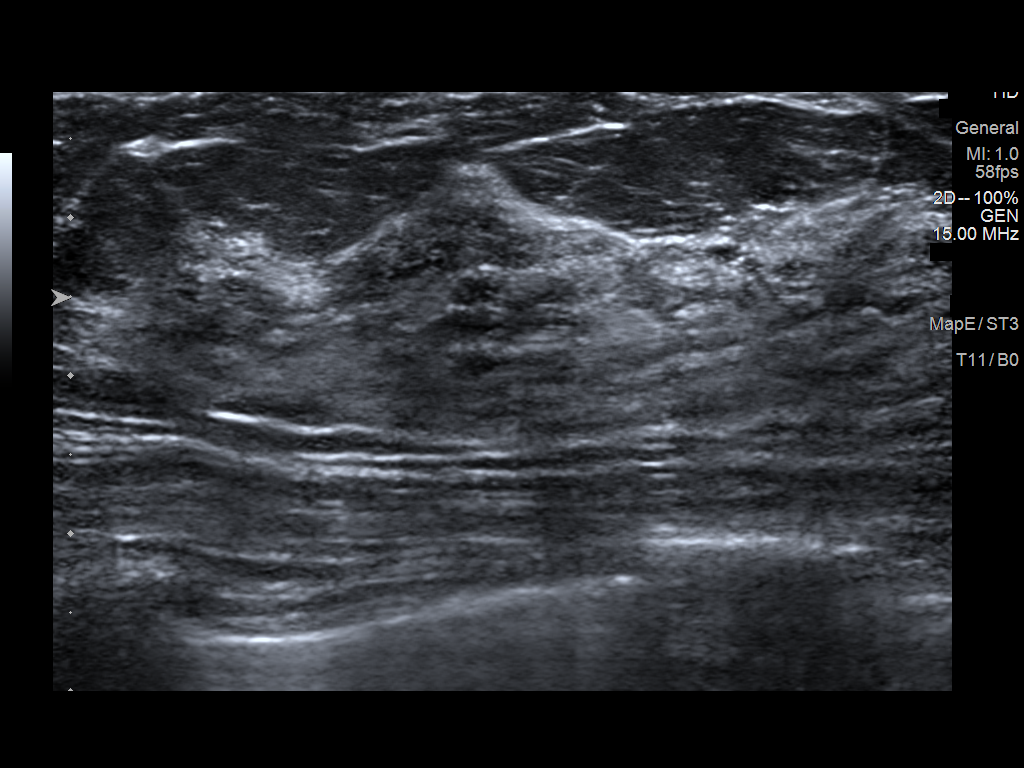
[im 2/3]
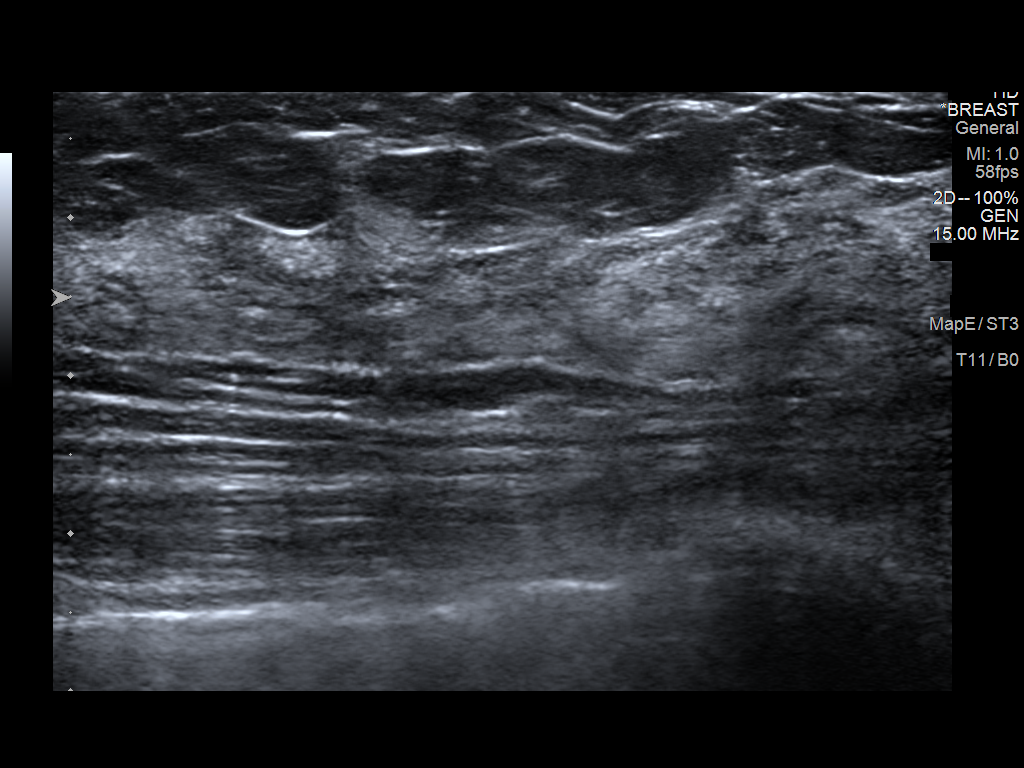
[im 3/3]
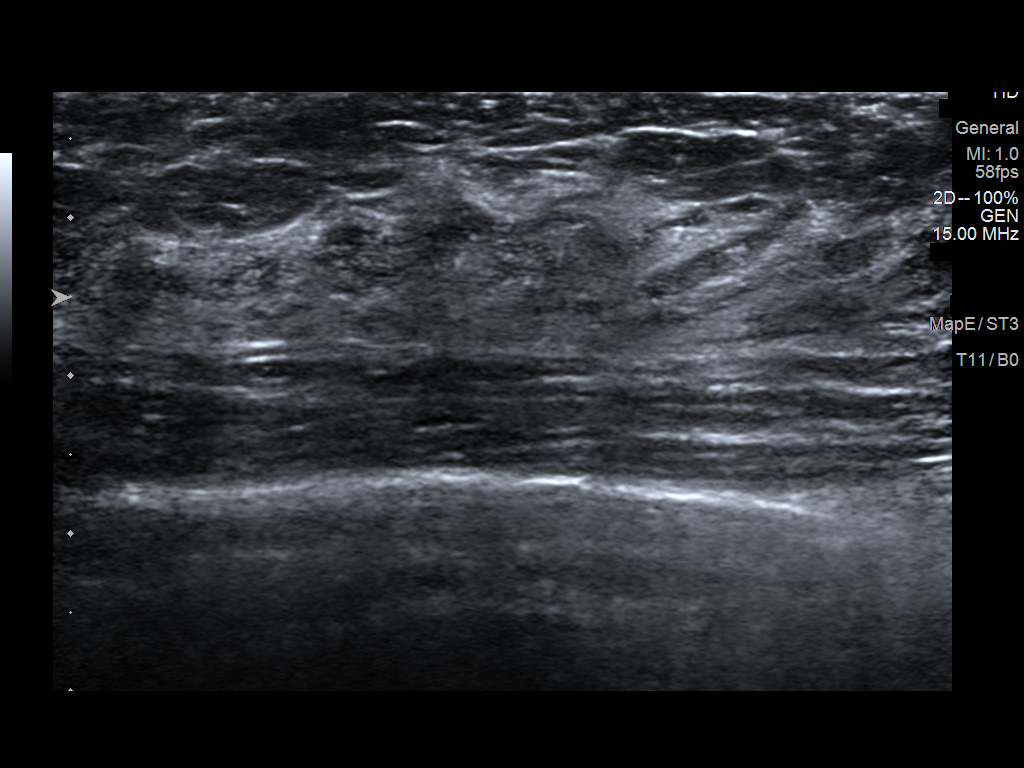

[3 of 3 positions shown; findings below may reference images not displayed]

ACR Breast Density Category d: The breast tissue is extremely dense,
which lowers the sensitivity of mammography.
FINDINGS: Spot compression tomosynthesis images through the questioned
distortion demonstrates resolution of this appearance. No persistent
distortion is identified. In the superior left breast there is an
asymmetry which appears less dense on the spot compression
tomosynthesis images, and somewhat similar to the patient's prior
studies. However, due to the extreme density of the patient's breast
tissue, ultrasound will be performed for further evaluation.

Mammographic images were processed with CAD.

Ultrasound of the superior left breast demonstrates normal
fibroglandular tissue. No suspicious masses or areas of shadowing
are identified.
IMPRESSION: Resolution of the questioned distortion and the asymmetry of the
left breast consistent with overlapping fibroglandular tissue.

RECOMMENDATION:
Screening mammogram in one year.(Code:[AU])

I have discussed the findings and recommendations with the patient.
Results were also provided in writing at the conclusion of the
visit. If applicable, a reminder letter will be sent to the patient
regarding the next appointment.

BI-RADS CATEGORY  1: Negative.

## 2019-01-28 ENCOUNTER — Ambulatory Visit (INDEPENDENT_AMBULATORY_CARE_PROVIDER_SITE_OTHER): Payer: Self-pay | Admitting: Plastic Surgery

## 2019-01-28 ENCOUNTER — Other Ambulatory Visit: Payer: Self-pay

## 2019-01-28 ENCOUNTER — Encounter: Payer: Self-pay | Admitting: Plastic Surgery

## 2019-01-28 VITALS — BP 102/63 | HR 74 | Temp 98.7°F | Ht 61.0 in | Wt 134.0 lb

## 2019-01-28 DIAGNOSIS — Z719 Counseling, unspecified: Secondary | ICD-10-CM

## 2019-01-28 NOTE — Progress Notes (Signed)
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 I discussed with the patient this proposed procedure of botulinum toxin injections, which is customized depending on the particular needs of the patient. It is performed on facial rhytids as a temporary correction. The alternatives were discussed with the patient. The risks were addressed including bleeding, scarring, infection, damage to deeper structures, asymmetry, and chronic pain, which may occur infrequently after a procedure. The individual's choice to undergo a surgical procedure is based on the comparison of risks to potential benefits. Other risks include unsatisfactory results, brow ptosis, eyelid ptosis, allergic reaction, temporary paralysis, which should go away with time, bruising, blurring disturbances and delayed healing. Botulinum toxin injections do not arrest the aging process or produce permanent tightening of the eyelid.  Operative intervention maybe necessary to maintain the results of a blepharoplasty or botulinum toxin. The patient understands and wishes to proceed. An informed consent was signed and informational brochures given to her prior to the procedure.  Procedure: The area was prepped with alcohol and dried with a clean gauze. Using a clean technique, the botulinum toxin was diluted with 1.25 cc of preservative-free normal saline which was slowly injected with an 18 gauge needle in a tuberculin syringes.  A 32 gauge needles were then used to inject the botulinum toxin. This mixture allow for an aliquot of 5 units per 0.1 cc in each injection site.    Subsequently the mixture was injected in the glabellar and forehead area with preservation of the temporal branch to the  lateral eyebrow as well as into each lateral canthal area beginning from the lateral orbital rim medial to the zygomaticus major in 3 separate areas. A total of 40 Units of botulinum toxin was used. The forehead and glabellar area was injected with care to inject intramuscular only while holding pressure on the supratrochlear vessels in each area during each injection on either side of the medial corrugators. The injection proceeded vertically superiorly to the medial 2/3 of the frontalis muscle and superior 2/3 of the lateral frontalis, again with preservation of the frontal branch.  The area was prepped with chlorhexidine and dried with a clean gauze. Using a clean technique, a 30 gauge needle / cannula was then used to inject the filler into the nasal labial folds and perioral wrinkles lateral inferior. This was done with one syringe.   The midface area was injected at the medial sub-region of the mid-face.  No complications were noted. Light pressure was held for 5 minutes. She was instructed explicitly in post-operative care.  No complications were noted. Light pressure was held for 5 minutes. She was instructed explicitly in post-operative care.  Botox LOT:  [id number] [id number] EXP:  4/23  Vollure XC LOT: [id number] EXP: 2019-10-18

## 2019-09-20 ENCOUNTER — Other Ambulatory Visit: Payer: Self-pay

## 2019-09-20 ENCOUNTER — Ambulatory Visit (INDEPENDENT_AMBULATORY_CARE_PROVIDER_SITE_OTHER): Payer: Self-pay | Admitting: Plastic Surgery

## 2019-09-20 ENCOUNTER — Encounter: Payer: Self-pay | Admitting: Plastic Surgery

## 2019-09-20 VITALS — BP 106/72 | HR 83 | Temp 98.4°F | Ht 61.0 in | Wt 138.2 lb

## 2019-09-20 DIAGNOSIS — Z719 Counseling, unspecified: Secondary | ICD-10-CM

## 2019-09-20 NOTE — Progress Notes (Signed)
Botulinum Toxin Procedure Note  Procedure: Cosmetic botulinum toxin   Pre-operative Diagnosis: Dynamic rhytides  Post-operative Diagnosis: Same  Complications:  None  Brief history: The patient desires botulinum toxin injection of her forehead. I discussed with the patient this proposed procedure of botulinum toxin injections, which is customized depending on the particular needs of the patient. It is performed on facial rhytids as a temporary correction. The alternatives were discussed with the patient. The risks were addressed including bleeding, scarring, infection, damage to deeper structures, asymmetry, and chronic pain, which may occur infrequently after a procedure. The individual's choice to undergo a surgical procedure is based on the comparison of risks to potential benefits. Other risks include unsatisfactory results, brow ptosis, eyelid ptosis, allergic reaction, temporary paralysis, which should go away with time, bruising, blurring disturbances and delayed healing. Botulinum toxin injections do not arrest the aging process or produce permanent tightening of the eyelid.  Operative intervention maybe necessary to maintain the results of a blepharoplasty or botulinum toxin. The patient understands and wishes to proceed.  Procedure: The area was prepped with alcohol and dried with a clean gauze. Using a clean technique, the botulinum toxin was diluted with 1.25 cc of preservative-free normal saline which was slowly injected with an 18 gauge needle in a tuberculin syringes.  A 32 gauge needles were then used to inject the botulinum toxin. This mixture allow for an aliquot of 5 units per 0.1 cc in each injection site.    Subsequently the mixture was injected in the glabellar and forehead area with preservation of the temporal branch to the lateral eyebrow as well as into each lateral canthal area beginning from the lateral orbital rim medial to the zygomaticus major in 3 separate areas. A total  of 30 Units of botulinum toxin was used. The forehead and glabellar area was injected with care to inject intramuscular only while holding pressure on the supratrochlear vessels in each area during each injection on either side of the medial corrugators. The injection proceeded vertically superiorly to the medial 2/3 of the frontalis muscle and superior 2/3 of the lateral frontalis, again with preservation of the frontal branch.  No complications were noted. Light pressure was held for 5 minutes. She was instructed explicitly in post-operative care.  Botox LOT:  [id number] [id number] EXP:  10/23  

## 2019-11-21 ENCOUNTER — Encounter: Payer: Self-pay | Admitting: Family Medicine

## 2019-11-21 ENCOUNTER — Ambulatory Visit (INDEPENDENT_AMBULATORY_CARE_PROVIDER_SITE_OTHER): Payer: BC Managed Care – PPO | Admitting: Family Medicine

## 2019-11-21 ENCOUNTER — Other Ambulatory Visit: Payer: Self-pay

## 2019-11-21 VITALS — BP 102/62 | HR 75 | Temp 98.1°F | Ht 61.0 in | Wt 140.1 lb

## 2019-11-21 DIAGNOSIS — R002 Palpitations: Secondary | ICD-10-CM

## 2019-11-21 DIAGNOSIS — R079 Chest pain, unspecified: Secondary | ICD-10-CM | POA: Diagnosis not present

## 2019-11-21 DIAGNOSIS — M79671 Pain in right foot: Secondary | ICD-10-CM

## 2019-11-21 DIAGNOSIS — Z0000 Encounter for general adult medical examination without abnormal findings: Secondary | ICD-10-CM

## 2019-11-21 DIAGNOSIS — M79672 Pain in left foot: Secondary | ICD-10-CM

## 2019-11-21 LAB — COMPREHENSIVE METABOLIC PANEL
ALT: 20 U/L (ref 0–35)
AST: 15 U/L (ref 0–37)
Albumin: 4.4 g/dL (ref 3.5–5.2)
Alkaline Phosphatase: 57 U/L (ref 39–117)
BUN: 12 mg/dL (ref 6–23)
CO2: 29 mEq/L (ref 19–32)
Calcium: 9.5 mg/dL (ref 8.4–10.5)
Chloride: 103 mEq/L (ref 96–112)
Creatinine, Ser: 0.81 mg/dL (ref 0.40–1.20)
GFR: 74.98 mL/min (ref >60.00)
Glucose, Bld: 113 mg/dL — ABNORMAL HIGH (ref 70–99)
Potassium: 4.1 mEq/L (ref 3.5–5.1)
Sodium: 140 mEq/L (ref 135–145)
Total Bilirubin: 0.6 mg/dL (ref 0.2–1.2)
Total Protein: 6.3 g/dL (ref 6.0–8.3)

## 2019-11-21 LAB — CBC
HCT: 39.7 % (ref 36.0–46.0)
Hemoglobin: 13.2 g/dL (ref 12.0–15.0)
MCHC: 33.2 g/dL (ref 30.0–36.0)
MCV: 78.9 fl (ref 78.0–100.0)
Platelets: 292 10*3/uL (ref 150.0–400.0)
RBC: 5.03 Mil/uL (ref 3.87–5.11)
RDW: 13.2 % (ref 11.5–15.5)
WBC: 5.3 10*3/uL (ref 4.0–10.5)

## 2019-11-21 LAB — LIPID PANEL
Cholesterol: 207 mg/dL — ABNORMAL HIGH (ref 0–200)
HDL: 54.6 mg/dL (ref >39.00)
LDL Cholesterol: 127 mg/dL — ABNORMAL HIGH (ref 0–99)
NonHDL: 152.49
Total CHOL/HDL Ratio: 4
Triglycerides: 128 mg/dL (ref 0.0–149.0)
VLDL: 25.6 mg/dL (ref 0.0–40.0)

## 2019-11-21 LAB — T4, FREE: Free T4: 0.93 ng/dL (ref 0.60–1.60)

## 2019-11-21 LAB — TSH: TSH: 4.38 u[IU]/mL (ref 0.35–4.50)

## 2019-11-21 NOTE — Patient Instructions (Addendum)
If you do not hear anything about your referral in the next 1-2 weeks, call our office and ask for an update.  Keep the diet clean and stay active.  Give [place] 2-3 business days to get the results of your labs back.   Mind caffeine intake.   If your symptoms worsen, consider seeking emergent.   Let us know if you need anything.

## 2019-11-21 NOTE — Progress Notes (Signed)
Chief Complaint  Patient presents with  . Follow-up  . Foot Pain    Linda Underwood is a 49 y.o. female here for palpitations.  Length of issue: 1.5 months How long does it last: several seconds Light headedness/passing out? Yes; has not passed out Chest pain/shortness of breath? Yes; pressure can last for a few minutes Seems to be brought on by exertion Hx of arrhythmia? No Medication changes/illicit substances? Yes  She is a former smoker.  Assoc b/l foot pain that has been going on for same amount of time. +famhx of PAD. No tenderness when she pushes, concerned about circulation.   Past Medical History:  Diagnosis Date  . Bipolar 2 disorder [facility])    Past Surgical History:  Procedure Laterality Date  . NO PAST SURGERIES     No Known Allergies Allergies as of 11/21/2019   No Known Allergies     Medication List       Accurate as of November 21, 2019  8:03 AM. If you have any questions, ask your nurse or doctor.        STOP taking these medications   Albuterol Sulfate 108 (90 Base) MCG/ACT Aepb Commonly known as: PROAIR RESPICLICK Stopped by: Sharlene Dory, DO   beclomethasone 40 MCG/ACT inhaler Commonly known as: QVAR Stopped by: Sharlene Dory, DO     TAKE these medications   clonazePAM 1 MG tablet Commonly known as: KLONOPIN Take 1 mg by mouth daily.   lamoTRIgine 150 MG tablet Commonly known as: LAMICTAL Take 150 mg by mouth daily.       BP 102/62 (BP Location: Left Arm, Patient Position: Sitting, Cuff Size: Normal)   Pulse 75   Temp 98.1 F (36.7 C) (Oral)   Ht 5\' 1"  (1.549 m)   Wt 140 lb 2 oz (63.6 kg)   SpO2 95%   BMI 26.48 kg/m  Gen: Awake, alert, appearing stated age Eyes: PERRLA Mouth: MMM Heart: RRR, no bruits, no LE edema; 2+ DP pulses b/l Lungs: CTAB, no accessory muscle use Neuro: No cerebellar signs MSK: CP not reproducible to palpation; no ttp over b/l feet Psych: Age appropriate judgment and insight, mood/affect  WNL  Palpitations - Plan: Ambulatory referral to Cardiology, TSH, T4, free, Comprehensive metabolic panel, CBC, EKG 12-Lead  Bilateral foot pain  Exertional chest pain  Well adult exam - Plan: Lipid panel  EKG NSR, nml axis, no interval abn, no T wave changes or signs of ischemia, good R wave prog.  Refer cards for palpitation eval, claudication? Ck labs. F/u in 1 mo for CPE.  The patient voiced understanding and agreement to the plan.  Kendall Justo 8:03 AM 11/21/19

## 2019-12-13 NOTE — Progress Notes (Signed)
Cardiology Office Note:    Date:  12/14/2019   ID:  Linda Underwood, DOB 12-03-1970, MRN [id number]  PCP:  Linda Dory, DO  Cardiologist:  Norman Herrlich, MD   Referring MD: Linda Underwood*  ASSESSMENT:    1. Palpitations    PLAN:    In order of problems listed above:  1. Her predominant symptom is palpitation suggestive of atrial arrhythmia atrial tachycardia in particular.  I think the best approach is a 2-week ambulatory ZIO monitor to document arrhythmia. 2. Other complaints of chest pain shortness of breath further evaluation echocardiogram.  The symptoms are nonanginal at this time and on think she requires a myocardial perfusion test. 3. Lower extremity foot pain unrelated to her cardiac problems  Next appointment 4 weeks   Medication Adjustments/Labs and Tests Ordered: Current medicines are reviewed at length with the patient today.  Concerns regarding medicines are outlined above.  Orders Placed This Encounter  Procedures  . LONG TERM MONITOR-LIVE TELEMETRY (3-14 DAYS)  . EKG 12-Lead  . ECHOCARDIOGRAM COMPLETE   No orders of the defined types were placed in this encounter.    Chief Complaint  Patient presents with  . Palpitations    History of Present Illness:    Linda Underwood is a 49 y.o. female who is being seen today for the evaluation of patient at the request of Linda Underwood*. He was seen by primary care physician 11/21/2019 for potassium CBC and thyroid test were normal.  Her EKG reviewed by me independently shows sinus rhythm and is normal  She is [name] having come  in 1993 and is a background as an Art gallery manager in [place]. In the past she has had occasional palpitation never bothersome severe sustained. In the last few months she has noticed frequent episodes where her pulse flutters or feels forceful but also at times words rapid for in the range of 5 to 10 seconds with shortness of breath and lightheadedness.  She is not  had syncope.  She takes no over-the-counter counter proarrhythmic drugs and has no background history of congenital or rheumatic heart disease.  She also notices recently she short of breath doing things like climbing stairs that is new and also has positional chest pain when she bends over but not exertionally.  She has no identifiable cardiovascular risk factors.  She also notices that her feet are sore especially when she stands.  Past Medical History:  Diagnosis Date  . Bipolar 2 disorder [facility])     Past Surgical History:  Procedure Laterality Date  . HAND SURGERY Left     Current Medications: Current Meds  Medication Sig  . Ascorbic Acid (VITAMIN C) 500 MG CAPS Take by mouth daily.  . cholecalciferol (VITAMIN D3) 25 MCG (1000 UNIT) tablet Take 1,000 Units by mouth daily.  . clonazePAM (KLONOPIN) 1 MG tablet Take 1 mg by mouth daily.  Marland Kitchen lamoTRIgine (LAMICTAL) 150 MG tablet Take 150 mg by mouth daily.  . Multiple Vitamin (MULTIVITAMIN) tablet Take 1 tablet by mouth daily.  . vitamin B-12 (CYANOCOBALAMIN) 500 MCG tablet Take 500 mcg by mouth daily.  . Zinc 50 MG CAPS Take by mouth daily.     Allergies:   Patient has no known allergies.   Social History   Socioeconomic History  . Marital status: Married    Spouse name: Not on file  . Number of children: Not on file  . Years of education: Not on file  . Highest education level: Not  on file  Occupational History  . Not on file  Tobacco Use  . Smoking status: Former Smoker    Types: Cigarettes  . Smokeless tobacco: Never Used  Vaping Use  . Vaping Use: Never used  Substance and Sexual Activity  . Alcohol use: No  . Drug use: No  . Sexual activity: Yes    Partners: Male  Other Topics Concern  . Not on file  Social History Narrative  . Not on file   Social Determinants of Health   Financial Resource Strain:   . Difficulty of Paying Living Expenses:   Food Insecurity:   . Worried About Programme researcher, broadcasting/film/video in the  Last Year:   . Barista in the Last Year:   Transportation Needs:   . Freight forwarder (Medical):   Marland Kitchen Lack of Transportation (Non-Medical):   Physical Activity:   . Days of Exercise per Week:   . Minutes of Exercise per Session:   Stress:   . Feeling of Stress :   Social Connections:   . Frequency of Communication with Friends and Family:   . Frequency of Social Gatherings with Friends and Family:   . Attends Religious Services:   . Active Member of Clubs or Organizations:   . Attends Banker Meetings:   Marland Kitchen Marital Status:      Family History: The patient's family history includes Peripheral Artery Disease in her father and paternal grandmother.  ROS:   ROS Please see the history of present illness.     All other systems reviewed and are negative.  EKGs/Labs/Other Studies Reviewed:    The following studies were reviewed today:     Recent Labs: 11/21/2019: ALT 20; BUN 12; Creatinine, Ser 0.81; Hemoglobin 13.2; Platelets 292.0; Potassium 4.1; Sodium 140; TSH 4.38  Recent Lipid Panel    Component Value Date/Time   CHOL 207 (H) 11/21/2019 0735   TRIG 128.0 11/21/2019 0735   HDL 54.60 11/21/2019 0735   CHOLHDL 4 11/21/2019 0735   VLDL 25.6 11/21/2019 0735   LDLCALC 127 (H) 11/21/2019 0735    Physical Exam:    VS:  BP 100/70 (BP Location: Left Arm, Patient Position: Sitting, Cuff Size: Normal)   Pulse 80   Ht 5\' 1"  (1.549 m)   Wt 138 lb (62.6 kg)   SpO2 98%   BMI 26.07 kg/m     Wt Readings from Last 3 Encounters:  12/14/19 138 lb (62.6 kg)  11/21/19 140 lb 2 oz (63.6 kg)  09/20/19 138 lb 3.2 oz (62.7 kg)     GEN:  Well nourished, well developed in no acute distress HEENT: Normal NECK: No JVD; No carotid bruits LYMPHATICS: No lymphadenopathy CARDIAC: RRR, no murmurs, rubs, gallops RESPIRATORY:  Clear to auscultation without rales, wheezing or rhonchi  ABDOMEN: Soft, non-tender, non-distended MUSCULOSKELETAL:  No edema; No deformity   SKIN: Warm and dry NEUROLOGIC:  Alert and oriented x 3 PSYCHIATRIC:  Normal affect     Signed, 11/20/19, MD  12/14/2019 2:51 PM    [facility]

## 2019-12-14 ENCOUNTER — Ambulatory Visit (INDEPENDENT_AMBULATORY_CARE_PROVIDER_SITE_OTHER): Payer: BC Managed Care – PPO | Admitting: Cardiology

## 2019-12-14 ENCOUNTER — Ambulatory Visit (INDEPENDENT_AMBULATORY_CARE_PROVIDER_SITE_OTHER): Payer: BC Managed Care – PPO

## 2019-12-14 ENCOUNTER — Other Ambulatory Visit: Payer: Self-pay

## 2019-12-14 ENCOUNTER — Ambulatory Visit: Payer: BC Managed Care – PPO

## 2019-12-14 ENCOUNTER — Encounter: Payer: Self-pay | Admitting: Cardiology

## 2019-12-14 VITALS — BP 100/70 | HR 80 | Ht 61.0 in | Wt 138.0 lb

## 2019-12-14 DIAGNOSIS — R002 Palpitations: Secondary | ICD-10-CM

## 2019-12-14 NOTE — Patient Instructions (Signed)
Medication Instructions:  Your physician recommends that you continue on your current medications as directed. Please refer to the Current Medication list given to you today.  *If you need a refill on your cardiac medications before your next appointment, please call your pharmacy*   Lab Work: None If you have labs (blood work) drawn today and your tests are completely normal, you will receive your results only by:  MyChart Message (if you have MyChart) OR  A paper copy in the mail If you have any lab test that is abnormal or we need to change your treatment, we will call you to review the results.   Testing/Procedures: Your physician has requested that you have an echocardiogram. Echocardiography is a painless test that uses sound waves to create images of your heart. It provides your doctor with information about the size and shape of your heart and how well your hearts chambers and valves are working. This procedure takes approximately one hour. There are no restrictions for this procedure.  A zio monitor was ordered today. It will remain on for 14 days. You will then return monitor and event diary in provided box. It takes 1-2 weeks for report to be downloaded and returned to [place]. We will call you with the results. If monitor falls off or has orange flashing light, please call Zio for further instructions.      Follow-Up: At [facility], you and your health needs are our priority.  As part of our continuing mission to provide you with exceptional heart care, we have created designated Provider Care Teams.  These Care Teams include your primary Cardiologist (physician) and Advanced Practice Providers (APPs -  Physician Assistants and Nurse Practitioners) who all work together to provide you with the care you need, when you need it.  We recommend signing up for the patient portal called "MyChart".  Sign up information is provided on this After Visit Summary.  MyChart is used to  connect with patients for Virtual Visits (Telemedicine).  Patients are able to view lab/test results, encounter notes, upcoming appointments, etc.  Non-urgent messages can be sent to your provider as well.   To learn more about what you can do with MyChart, go to [email or web address].    Your next appointment:   4 week(s)  The format for your next appointment:   In Person  Provider:   Norman Herrlich, MD or Dr. Servando Salina   Other Instructions

## 2019-12-21 ENCOUNTER — Other Ambulatory Visit: Payer: Self-pay

## 2019-12-21 ENCOUNTER — Ambulatory Visit (HOSPITAL_COMMUNITY)
Admission: RE | Admit: 2019-12-21 | Discharge: 2019-12-21 | Disposition: A | Discharge status: Home / Self Care | Payer: BC Managed Care – PPO | Source: Ambulatory Visit | Attending: Cardiology | Admitting: Cardiology

## 2019-12-21 DIAGNOSIS — R002 Palpitations: Secondary | ICD-10-CM | POA: Diagnosis not present

## 2019-12-21 LAB — ECHOCARDIOGRAM COMPLETE
Area-P 1/2: 2.83 cm2
S' Lateral: 3 cm

## 2019-12-21 NOTE — Progress Notes (Signed)
Echocardiogram 2D Echocardiogram has been performed.  Linda Underwood 12/21/2019, 3:39 PM

## 2019-12-26 ENCOUNTER — Encounter: Payer: BC Managed Care – PPO | Admitting: Family Medicine

## 2020-01-06 ENCOUNTER — Ambulatory Visit (INDEPENDENT_AMBULATORY_CARE_PROVIDER_SITE_OTHER): Payer: BC Managed Care – PPO | Admitting: Family Medicine

## 2020-01-06 ENCOUNTER — Encounter: Payer: Self-pay | Admitting: Family Medicine

## 2020-01-06 ENCOUNTER — Other Ambulatory Visit: Payer: Self-pay

## 2020-01-06 VITALS — BP 108/60 | HR 70 | Temp 98.1°F | Ht 61.0 in | Wt 142.1 lb

## 2020-01-06 DIAGNOSIS — Z1159 Encounter for screening for other viral diseases: Secondary | ICD-10-CM | POA: Diagnosis not present

## 2020-01-06 DIAGNOSIS — Z0000 Encounter for general adult medical examination without abnormal findings: Secondary | ICD-10-CM | POA: Diagnosis not present

## 2020-01-06 DIAGNOSIS — M79672 Pain in left foot: Secondary | ICD-10-CM

## 2020-01-06 DIAGNOSIS — Z114 Encounter for screening for human immunodeficiency virus [HIV]: Secondary | ICD-10-CM | POA: Diagnosis not present

## 2020-01-06 DIAGNOSIS — M79671 Pain in right foot: Secondary | ICD-10-CM | POA: Diagnosis not present

## 2020-01-06 NOTE — Patient Instructions (Addendum)
Give [place] 2-3 business days to get the results of your labs back.   Keep the diet clean and stay active.  I recommend getting the flu shot in mid October. This suggestion would change if the CDC comes out with a different recommendation.   I recommend getting the Pfizer or Moderna vaccination. Please send me a message if you have any questions or concerns you would like me to address.   If you do not hear anything about your referral in the next 1-2 weeks, call our office and ask for an update.  Let us know if you need anything.

## 2020-01-06 NOTE — Progress Notes (Signed)
Chief Complaint  Patient presents with  . Annual Exam     Well Woman Linda Underwood is here for a complete physical.   Her last physical was >1 year ago.  Current diet: in general, diet can be sporadic. Current exercise: walking. Weight is up slightly and she denies fatigue out of ordinary. Seatbelt? Yes  Health Maintenance Pap/HPV- Yes Mammogram- Yes Tetanus- Yes Hep C screening- No HIV screening- No  Past Medical History:  Diagnosis Date  . Bipolar 2 disorder [facility])      Past Surgical History:  Procedure Laterality Date  . HAND SURGERY Left     Medications  Current Outpatient Medications on File Prior to Visit  Medication Sig Dispense Refill  . Ascorbic Acid (VITAMIN C) 500 MG CAPS Take by mouth daily.    . cholecalciferol (VITAMIN D3) 25 MCG (1000 UNIT) tablet Take 1,000 Units by mouth daily.    . clonazePAM (KLONOPIN) 1 MG tablet Take 1 mg by mouth daily.    Marland Kitchen lamoTRIgine (LAMICTAL) 150 MG tablet Take 150 mg by mouth daily.    . Multiple Vitamin (MULTIVITAMIN) tablet Take 1 tablet by mouth daily.    . vitamin B-12 (CYANOCOBALAMIN) 500 MCG tablet Take 500 mcg by mouth daily.    . Zinc 50 MG CAPS Take by mouth daily.     Allergies No Known Allergies  Review of Systems: Constitutional:  no unexpected weight changes Eye:  no recent significant change in vision Ear/Nose/Mouth/Throat:  Ears:  no recent change in hearing Nose/Mouth/Throat:  no complaints of nasal congestion, no sore throat Cardiovascular: + chest pain Respiratory:  no shortness of breath Gastrointestinal:  no abdominal pain, no change in bowel habits GU:  Female: negative for dysuria or pelvic pain Musculoskeletal/Extremities:  +bilateral foot pain Integumentary (Skin/Breast):  no abnormal skin lesions reported  Neurologic:  no headaches Endocrine:  denies fatigue Hematologic/Lymphatic:  No areas of easy bleeding  Exam BP 108/60 (BP Location: Left Arm, Patient Position: Sitting, Cuff Size: Normal)    Pulse 70   Temp 98.1 F (36.7 C) (Oral)   Ht 5\' 1"  (1.549 m)   Wt 142 lb 2 oz (64.5 kg)   SpO2 98%   BMI 26.85 kg/m  General:  well developed, well nourished, in no apparent distress Skin:  no significant moles, warts, or growths Head:  no masses, lesions, or tenderness Eyes:  pupils equal and round, sclera anicteric without injection Ears:  canals without lesions, TMs shiny without retraction, no obvious effusion, no erythema Nose:  nares patent, septum midline, mucosa normal, and no drainage or sinus tenderness Throat/Pharynx:  lips and gingiva without lesion; tongue and uvula midline; non-inflamed pharynx; no exudates or postnasal drainage Neck: neck supple without adenopathy, thyromegaly, or masses Lungs:  clear to auscultation, breath sounds equal bilaterally, no respiratory distress Cardio:  regular rate and rhythm, no LE edema Abdomen:  abdomen soft, nontender; bowel sounds normal; no masses or organomegaly Genital: Defer to GYN Musculoskeletal: No ttp over b/l feet including bony ttp, heel/PF insertion/origin, calc bursa.  Extremities:  no clubbing, cyanosis, or edema, no deformities, no skin discoloration Neuro:  gait normal; deep tendon reflexes normal and symmetric Psych: well oriented with normal range of affect and appropriate judgment/insight  Assessment and Plan  Well adult exam - Plan: Hemoglobin A1c, Lipid panel, CANCELED: Lipid panel, CANCELED: Hemoglobin A1c  Encounter for hepatitis C screening test for low risk patient - Plan: Hepatitis C antibody  Screening for HIV (human immunodeficiency virus) - Plan:  HIV Antibody (routine testing w rflx)  Bilateral foot pain - Plan: Ambulatory referral to Podiatry   Well 49 y.o. female. Counseled on diet and exercise. Other orders as above. Not sure what to make of her foot pain. Will refer to podiatry for further input. Doubt neuropathic lesion, lack of ttp makes msk less likely, could be structural in that she needs  orthotics for support given improvement with certain footwear. Has appt w cards in 2 weeks for palpitations/CP.  Follow up in 1 yr or prn. The patient voiced understanding and agreement to the plan.  Jilda Roche [place], DO 01/06/20 9:50 AM

## 2020-01-09 ENCOUNTER — Other Ambulatory Visit: Payer: Self-pay | Admitting: Family Medicine

## 2020-01-09 DIAGNOSIS — E785 Hyperlipidemia, unspecified: Secondary | ICD-10-CM

## 2020-01-09 LAB — HEMOGLOBIN A1C
Hgb A1c MFr Bld: 5.4 % of total Hgb (ref <5.7)
Mean Plasma Glucose: 108 (calc)
eAG (mmol/L): 6 (calc)

## 2020-01-09 LAB — LIPID PANEL
Cholesterol: 220 mg/dL — ABNORMAL HIGH (ref <200)
HDL: 55 mg/dL (ref >=50)
LDL Cholesterol (Calc): 140 mg/dL (calc) — ABNORMAL HIGH
Non-HDL Cholesterol (Calc): 165 mg/dL (calc) — ABNORMAL HIGH (ref <130)
Total CHOL/HDL Ratio: 4 (calc) (ref <5.0)
Triglycerides: 124 mg/dL (ref <150)

## 2020-01-09 LAB — HEPATITIS C ANTIBODY
Hepatitis C Ab: NONREACTIVE
SIGNAL TO CUT-OFF: 0.01 (ref <1.00)

## 2020-01-09 LAB — HIV ANTIBODY (ROUTINE TESTING W REFLEX): HIV 1&2 Ab, 4th Generation: NONREACTIVE

## 2020-01-17 ENCOUNTER — Telehealth: Payer: Self-pay | Admitting: Cardiology

## 2020-01-17 DIAGNOSIS — I441 Atrioventricular block, second degree: Secondary | ICD-10-CM

## 2020-01-17 NOTE — Telephone Encounter (Signed)
Left message on patients voicemail to please return our call.   

## 2020-01-17 NOTE — Addendum Note (Signed)
Addended by: Delorse Limber I on: 01/17/2020 03:57 PM   Modules accepted: Orders

## 2020-01-17 NOTE — Telephone Encounter (Signed)
Spoke to [place] with [employer] and she let me know that the patient had a symptomatic single episode of a 2nd degree AV block with  2.2 seconds of ventricular asystole. This occurred on 12/17/19 at 11:13 AM and was a single episode.   I will route to Dr. Servando Salina to review since Dr. Dulce Sellar is out of the office at this time.

## 2020-01-17 NOTE — Telephone Encounter (Signed)
Please keep her appointment for September 10.  Also send referral to EP.

## 2020-01-17 NOTE — Telephone Encounter (Signed)
Joselyn with [employer] is calling to report abnormal Zio monitor results.

## 2020-01-17 NOTE — Telephone Encounter (Signed)
Attempted to contact the office to transfer call, however, did not get an answer.

## 2020-01-18 ENCOUNTER — Ambulatory Visit: Payer: Self-pay | Admitting: Podiatry

## 2020-01-18 NOTE — Telephone Encounter (Signed)
Left message on patients voicemail to please return our call.   

## 2020-01-18 NOTE — Telephone Encounter (Signed)
Pt is returning call.  

## 2020-01-18 NOTE — Telephone Encounter (Signed)
    Pt returning call, she said she is working to and might miss her call again. She said just to send her a message on her mychart

## 2020-01-18 NOTE — Telephone Encounter (Signed)
Patient message sent in MyChart

## 2020-01-20 ENCOUNTER — Other Ambulatory Visit: Payer: Self-pay

## 2020-01-20 ENCOUNTER — Ambulatory Visit (INDEPENDENT_AMBULATORY_CARE_PROVIDER_SITE_OTHER): Payer: BC Managed Care – PPO | Admitting: Cardiology

## 2020-01-20 ENCOUNTER — Encounter: Payer: Self-pay | Admitting: Cardiology

## 2020-01-20 VITALS — BP 91/57 | HR 80 | Ht 61.0 in | Wt 139.0 lb

## 2020-01-20 DIAGNOSIS — I441 Atrioventricular block, second degree: Secondary | ICD-10-CM | POA: Diagnosis not present

## 2020-01-20 DIAGNOSIS — R55 Syncope and collapse: Secondary | ICD-10-CM | POA: Diagnosis not present

## 2020-01-20 DIAGNOSIS — I493 Ventricular premature depolarization: Secondary | ICD-10-CM

## 2020-01-20 DIAGNOSIS — R42 Dizziness and giddiness: Secondary | ICD-10-CM | POA: Insufficient documentation

## 2020-01-20 HISTORY — DX: Dizziness and giddiness: R42

## 2020-01-20 HISTORY — DX: Atrioventricular block, second degree: I44.1

## 2020-01-20 HISTORY — DX: Ventricular premature depolarization: I49.3

## 2020-01-20 NOTE — Patient Instructions (Addendum)
Medication Instructions:  Your physician recommends that you continue on your current medications as directed. Please refer to the Current Medication list given to you today.  *If you need a refill on your cardiac medications before your next appointment, please call your pharmacy*   Lab Work: None ordered  If you have labs (blood work) drawn today and your tests are completely normal, you will receive your results only by: Marland Kitchen MyChart Message (if you have MyChart) OR . A paper copy in the mail If you have any lab test that is abnormal or we need to change your treatment, we will call you to review the results.   Testing/Procedures: None ordered   Follow-Up: At [facility], you and your health needs are our priority.  As part of our continuing mission to provide you with exceptional heart care, we have created designated Provider Care Teams.  These Care Teams include your primary Cardiologist (physician) and Advanced Practice Providers (APPs -  Physician Assistants and Nurse Practitioners) who all work together to provide you with the care you need, when you need it.  We recommend signing up for the patient portal called "MyChart".  Sign up information is provided on this After Visit Summary.  MyChart is used to connect with patients for Virtual Visits (Telemedicine).  Patients are able to view lab/test results, encounter notes, upcoming appointments, etc.  Non-urgent messages can be sent to your provider as well.   To learn more about what you can do with MyChart, go to [email or web address].    Your next appointment:   8 WEEKS  The format for your next appointment:   In Person  Provider:   Norman Herrlich, MD   Other Instructions

## 2020-01-20 NOTE — Progress Notes (Signed)
Cardiology Office Note:    Date:  01/20/2020   ID:  Linda Underwood, DOB 1970/09/30, MRN [id number]  PCP:  Sharlene Dory, DO  Cardiologist:  No primary care provider on file.  Electrophysiologist:  None   Referring MD: Sharlene Dory*   " I am still experiencing lightheadedness and sometimes I feel like I am going to pass out"  History of Present Illness:    Linda Underwood is a 49 y.o. female with a hx of palpitations suspected to be atrial arrhythmia.  Patient did see Dr. Dulce Sellar at which time a monitor was placed on her given the fact that she was experiencing skipped beats and palpitations.  She was able to wear the monitor is here today for follow-up visit.  She tells me that she still is experiencing some palpitations but the skipped beats and lightheadedness are the worse.  Past Medical History:  Diagnosis Date  . Bipolar 2 disorder [facility])     Past Surgical History:  Procedure Laterality Date  . HAND SURGERY Left     Current Medications: Current Meds  Medication Sig  . Ascorbic Acid (VITAMIN C) 500 MG CAPS Take by mouth daily.  . cholecalciferol (VITAMIN D3) 25 MCG (1000 UNIT) tablet Take 1,000 Units by mouth daily.  . clonazePAM (KLONOPIN) 1 MG tablet Take 1 mg by mouth daily.  Marland Kitchen lamoTRIgine (LAMICTAL) 150 MG tablet Take 150 mg by mouth daily.  . Multiple Vitamin (MULTIVITAMIN) tablet Take 1 tablet by mouth daily.  . vitamin B-12 (CYANOCOBALAMIN) 500 MCG tablet Take 500 mcg by mouth daily.  . Zinc 50 MG CAPS Take by mouth daily.     Allergies:   Patient has no known allergies.   Social History   Socioeconomic History  . Marital status: Married    Spouse name: Not on file  . Number of children: Not on file  . Years of education: Not on file  . Highest education level: Not on file  Occupational History  . Not on file  Tobacco Use  . Smoking status: Former Smoker    Types: Cigarettes  . Smokeless tobacco: Never Used  Vaping Use  . Vaping Use:  Never used  Substance and Sexual Activity  . Alcohol use: No  . Drug use: No  . Sexual activity: Yes    Partners: Male  Other Topics Concern  . Not on file  Social History Narrative  . Not on file   Social Determinants of Health   Financial Resource Strain:   . Difficulty of Paying Living Expenses: Not on file  Food Insecurity:   . Worried About Programme researcher, broadcasting/film/video in the Last Year: Not on file  . Ran Out of Food in the Last Year: Not on file  Transportation Needs:   . Lack of Transportation (Medical): Not on file  . Lack of Transportation (Non-Medical): Not on file  Physical Activity:   . Days of Exercise per Week: Not on file  . Minutes of Exercise per Session: Not on file  Stress:   . Feeling of Stress : Not on file  Social Connections:   . Frequency of Communication with Friends and Family: Not on file  . Frequency of Social Gatherings with Friends and Family: Not on file  . Attends Religious Services: Not on file  . Active Member of Clubs or Organizations: Not on file  . Attends Banker Meetings: Not on file  . Marital Status: Not on file  Family History: The patient's family history includes Peripheral Artery Disease in her father and paternal grandmother.  ROS:   Review of Systems  Constitution: Negative for decreased appetite, fever and weight gain.  HENT: Negative for congestion, ear discharge, hoarse voice and sore throat.   Eyes: Negative for discharge, redness, vision loss in right eye and visual halos.  Cardiovascular: Negative for chest pain, dyspnea on exertion, leg swelling, orthopnea and palpitations.  Respiratory: Negative for cough, hemoptysis, shortness of breath and snoring.   Endocrine: Negative for heat intolerance and polyphagia.  Hematologic/Lymphatic: Negative for bleeding problem. Does not bruise/bleed easily.  Skin: Negative for flushing, nail changes, rash and suspicious lesions.  Musculoskeletal: Negative for arthritis,  joint pain, muscle cramps, myalgias, neck pain and stiffness.  Gastrointestinal: Negative for abdominal pain, bowel incontinence, diarrhea and excessive appetite.  Genitourinary: Negative for decreased libido, genital sores and incomplete emptying.  Neurological: Negative for brief paralysis, focal weakness, headaches and loss of balance.  Psychiatric/Behavioral: Negative for altered mental status, depression and suicidal ideas.  Allergic/Immunologic: Negative for HIV exposure and persistent infections.    EKGs/Labs/Other Studies Reviewed:    The following studies were reviewed today:   EKG: None today   The patient wore the monitor for 11 days 18 hours starting December 14, 2019. Indication: Palpitations The minimum heart rate was 35 bpm, maximum heart rate was 168 bpm, and average heart rate was 79 bpm. Predominant underlying rhythm was Sinus Rhythm.  1 episode of AV Block (2nd Mobitz II) occurred, lasting a total of 2 seconds on December 17, 2019 at 11:14 am.   Premature atrial complexes were rare less than 1%. Premature Ventricular complexes rare less than 1%.    No ventricular tachycardia, no supraventricular tachycardia and no atrial fibrillation present.  8 patient triggered events: 1 associated with premature ventricular complex, 2 associated with premature atrial complex and the remaining associated with sinus rhythm.  Conclusion: This study is is remarkable for the following.   1. One episode of Mobitz II 2nd AV Block.   2. Rare PVC and PAC.    Echo IMPRESSIONS 8/112021 1. Left ventricular ejection fraction, by estimation, is 55 to 60%. The  left ventricle has normal function. The left ventricle has no regional  wall motion abnormalities. Left ventricular diastolic parameters are  consistent with Grade II diastolic  dysfunction (pseudonormalization). The average left ventricular global  longitudinal strain is -17.0 %. The global longitudinal strain is normal.  2.  Right ventricular systolic function is normal. The right ventricular  size is normal. There is normal pulmonary artery systolic pressure.  3. The mitral valve is normal in structure. No evidence of mitral valve  regurgitation. No evidence of mitral stenosis.  4. The aortic valve is tricuspid. Aortic valve regurgitation is not  visualized. No aortic stenosis is present.  5. The inferior vena cava is normal in size with greater than 50%  respiratory variability, suggesting right atrial pressure of 3 mmHg.   Recent Labs: 11/21/2019: ALT 20; BUN 12; Creatinine, Ser 0.81; Hemoglobin 13.2; Platelets 292.0; Potassium 4.1; Sodium 140; TSH 4.38  Recent Lipid Panel    Component Value Date/Time   CHOL 220 (H) 01/06/2020 0900   TRIG 124 01/06/2020 0900   HDL 55 01/06/2020 0900   CHOLHDL 4.0 01/06/2020 0900   VLDL 25.6 11/21/2019 0735   LDLCALC 140 (H) 01/06/2020 0900    Physical Exam:    VS:  BP (!) 91/57   Pulse 80   Ht 5\' 1"  (  1.549 m)   Wt 139 lb (63 kg)   SpO2 98%   BMI 26.26 kg/m     Wt Readings from Last 3 Encounters:  01/20/20 139 lb (63 kg)  01/06/20 142 lb 2 oz (64.5 kg)  12/14/19 138 lb (62.6 kg)     GEN: Well nourished, well developed in no acute distress HEENT: Normal NECK: No JVD; No carotid bruits LYMPHATICS: No lymphadenopathy CARDIAC: S1S2 noted,RRR, no murmurs, rubs, gallops RESPIRATORY:  Clear to auscultation without rales, wheezing or rhonchi  ABDOMEN: Soft, non-tender, non-distended, +bowel sounds, no guarding. EXTREMITIES: No edema, No cyanosis, no clubbing MUSCULOSKELETAL:  No deformity  SKIN: Warm and dry NEUROLOGIC:  Alert and oriented x 3, non-focal PSYCHIATRIC:  Normal affect, good insight  ASSESSMENT:    1. Mobitz type 2 second degree atrioventricular block   2. PVC (premature ventricular contraction)   3. Postural dizziness with presyncope    PLAN:     Her monitor did show one episode of second-degree type II AV block.  This was during the  daytime.  I discussed this with the patient.  At this time I does like the patient to see EP to get their way in as well.  At this time we will hold off on any AV nodal blockers.  She still does have intermittent lightheadedness and presyncope episodes.  I have encouraged patient if this persist to go to the nearest emergency department.  I was also again able to review her echocardiogram with her and she has no valvular abnormalities.  Her blood pressure is slightly lower at this time she may benefit from midodrine.  But for now I will await her evaluation for EP peer  The patient is in agreement with the above plan. The patient left the office in stable condition.  The patient will follow up in 8 weeks with Dr. Dulce Sellar.   Medication Adjustments/Labs and Tests Ordered: Current medicines are reviewed at length with the patient today.  Concerns regarding medicines are outlined above.  No orders of the defined types were placed in this encounter.  No orders of the defined types were placed in this encounter.   Patient Instructions  Medication Instructions:  Your physician recommends that you continue on your current medications as directed. Please refer to the Current Medication list given to you today.  *If you need a refill on your cardiac medications before your next appointment, please call your pharmacy*   Lab Work: None ordered  If you have labs (blood work) drawn today and your tests are completely normal, you will receive your results only by: Marland Kitchen MyChart Message (if you have MyChart) OR . A paper copy in the mail If you have any lab test that is abnormal or we need to change your treatment, we will call you to review the results.   Testing/Procedures: None ordered   Follow-Up: At [facility], you and your health needs are our priority.  As part of our continuing mission to provide you with exceptional heart care, we have created designated Provider Care Teams.  These  Care Teams include your primary Cardiologist (physician) and Advanced Practice Providers (APPs -  Physician Assistants and Nurse Practitioners) who all work together to provide you with the care you need, when you need it.  We recommend signing up for the patient portal called "MyChart".  Sign up information is provided on this After Visit Summary.  MyChart is used to connect with patients for Virtual Visits (Telemedicine).  Patients are  able to view lab/test results, encounter notes, upcoming appointments, etc.  Non-urgent messages can be sent to your provider as well.   To learn more about what you can do with MyChart, go to [email or web address].    Your next appointment:   8 WEEKS  The format for your next appointment:   In Person  Provider:   Norman Herrlich, MD   Other Instructions     Adopting a Healthy Lifestyle.  Know what a healthy weight is for you (roughly BMI <25) and aim to maintain this   Aim for 7+ servings of fruits and vegetables daily   65-80+ fluid ounces of water or unsweet tea for healthy kidneys   Limit to max 1 drink of alcohol per day; avoid smoking/tobacco   Limit animal fats in diet for cholesterol and heart health - choose grass fed whenever available   Avoid highly processed foods, and foods high in saturated/trans fats   Aim for low stress - take time to unwind and care for your mental health   Aim for 150 min of moderate intensity exercise weekly for heart health, and weights twice weekly for bone health   Aim for 7-9 hours of sleep daily   When it comes to diets, agreement about the perfect plan isnt easy to find, even among the experts. Experts at the [facility] [employer] developed an idea known as the Healthy Eating Plate. Just imagine a plate divided into logical, healthy portions.   The emphasis is on diet quality:   Load up on vegetables and fruits - one-half of your plate: Aim for color and variety, and remember that  potatoes dont count.   Go for whole grains - one-quarter of your plate: Whole wheat, barley, wheat berries, quinoa, oats, brown rice, and foods made with them. If you want pasta, go with whole wheat pasta.   Protein power - one-quarter of your plate: Fish, chicken, beans, and nuts are all healthy, versatile protein sources. Limit red meat.   The diet, however, does go beyond the plate, offering a few other suggestions.   Use healthy plant oils, such as olive, canola, soy, corn, sunflower and peanut. Check the labels, and avoid partially hydrogenated oil, which have unhealthy trans fats.   If youre thirsty, drink water. Coffee and tea are good in moderation, but skip sugary drinks and limit milk and dairy products to one or two daily servings.   The type of carbohydrate in the diet is more important than the amount. Some sources of carbohydrates, such as vegetables, fruits, whole grains, and beans-are healthier than others.   Finally, stay active  Signed, Thomasene Ripple, DO  01/20/2020 11:16 AM    [facility]

## 2020-01-25 ENCOUNTER — Encounter: Payer: Self-pay | Admitting: Internal Medicine

## 2020-01-25 ENCOUNTER — Ambulatory Visit (INDEPENDENT_AMBULATORY_CARE_PROVIDER_SITE_OTHER): Payer: BC Managed Care – PPO | Admitting: Internal Medicine

## 2020-01-25 ENCOUNTER — Other Ambulatory Visit: Payer: Self-pay

## 2020-01-25 DIAGNOSIS — R002 Palpitations: Secondary | ICD-10-CM | POA: Diagnosis not present

## 2020-01-25 DIAGNOSIS — R42 Dizziness and giddiness: Secondary | ICD-10-CM | POA: Diagnosis not present

## 2020-01-25 HISTORY — DX: Dizziness and giddiness: R42

## 2020-01-25 HISTORY — DX: Palpitations: R00.2

## 2020-01-25 NOTE — Progress Notes (Signed)
HPI Ms. Keeny is referred today by Dr. Servando Salina for evaluation of an abnormal cardiac monitor. She is a pleasant 49 yo woman with a h/o palpitations, dizziness and low blood pressure. She notes heaviness in her chest with exertion but also at other times not related to exertion, particularly emotional stress. She has no family h/o CAD. She has gained weight as she stopped exercising.  No Known Allergies   Current Outpatient Medications  Medication Sig Dispense Refill  . Ascorbic Acid (VITAMIN C) 500 MG CAPS Take by mouth daily.    . cholecalciferol (VITAMIN D3) 25 MCG (1000 UNIT) tablet Take 1,000 Units by mouth daily.    . clonazePAM (KLONOPIN) 1 MG tablet Take 1 mg by mouth daily.    Marland Kitchen lamoTRIgine (LAMICTAL) 150 MG tablet Take 150 mg by mouth daily.    . Multiple Vitamin (MULTIVITAMIN) tablet Take 1 tablet by mouth daily.    . vitamin B-12 (CYANOCOBALAMIN) 500 MCG tablet Take 500 mcg by mouth daily.    . Zinc 50 MG CAPS Take by mouth daily.     No current facility-administered medications for this visit.     Past Medical History:  Diagnosis Date  . Bipolar 2 disorder (HCC)     ROS:   All systems reviewed and negative except as noted in the HPI.   Past Surgical History:  Procedure Laterality Date  . HAND SURGERY Left      Family History  Problem Relation Age of Onset  . Peripheral Artery Disease Father   . Peripheral Artery Disease Paternal Grandmother      Social History   Socioeconomic History  . Marital status: Married    Spouse name: Not on file  . Number of children: Not on file  . Years of education: Not on file  . Highest education level: Not on file  Occupational History  . Not on file  Tobacco Use  . Smoking status: Former Smoker    Types: Cigarettes  . Smokeless tobacco: Never Used  Vaping Use  . Vaping Use: Never used  Substance and Sexual Activity  . Alcohol use: No  . Drug use: No  . Sexual activity: Yes    Partners: Male  Other  Topics Concern  . Not on file  Social History Narrative  . Not on file   Social Determinants of Health   Financial Resource Strain:   . Difficulty of Paying Living Expenses: Not on file  Food Insecurity:   . Worried About Programme researcher, broadcasting/film/video in the Last Year: Not on file  . Ran Out of Food in the Last Year: Not on file  Transportation Needs:   . Lack of Transportation (Medical): Not on file  . Lack of Transportation (Non-Medical): Not on file  Physical Activity:   . Days of Exercise per Week: Not on file  . Minutes of Exercise per Session: Not on file  Stress:   . Feeling of Stress : Not on file  Social Connections:   . Frequency of Communication with Friends and Family: Not on file  . Frequency of Social Gatherings with Friends and Family: Not on file  . Attends Religious Services: Not on file  . Active Member of Clubs or Organizations: Not on file  . Attends Banker Meetings: Not on file  . Marital Status: Not on file  Intimate Partner Violence:   . Fear of Current or Ex-Partner: Not on file  . Emotionally Abused: Not on  file  . Physically Abused: Not on file  . Sexually Abused: Not on file     BP 94/64   Pulse 84   Ht 5\' 1"  (1.549 m)   Wt 138 lb (62.6 kg)   SpO2 96%   BMI 26.07 kg/m   Physical Exam:  Well appearing 49 yo woman, NAD HEENT: Unremarkable Neck:  6 cm JVD, no thyromegally Lymphatics:  No adenopathy Back:  No CVA tenderness Lungs:  Clear with no wheezes HEART:  Regular rate rhythm, no murmurs, no rubs, no clicks Abd:  soft, positive bowel sounds, no organomegally, no rebound, no guarding Ext:  2 plus pulses, no edema, no cyanosis, no clubbing Skin:  No rashes no nodules Neuro:  CN II through XII intact, motor grossly intact  EKG - NSR with a normal PR and QRS  Zio monitor - I have reviewed. She has symptomatic PVC's and PAC's. She had a single episode of AV block with a single dropped QRS. There is very slight PR prolongation  before the dropped beat and the initial conducted beat after has a shorter PR interval.   Assess/Plan: 1. Abnormal ECG - her symptoms appear to be due to the PVC's and PAC's. I reassured the patient. We discussed avoidance of caffeine and ETOH. Her bp is low and I would not use a beta blocker. If she were to get worse flecainide might be a consideration but I don't think she is having enough ectopy to justify a class 1C drug.  2. Low bp - her bp has been running on the low side and I asked her to increase her salt intake. 3. AVWB - she had a single dropped beat while wearing her cardiac monitor. While she does have transient dizzy spells, I strongly suspect that her AVWB is not playing a role. I would avoid AV nodal blocking drugs.  4. Chest pressure - she is low risk for obstructive CAD but she does have chest pressure which is at time related to exertion. I would recommend an ischemic evaluation. I'll defer stress testing vs Coronary CT to Dr.'s Tobb/Munley.   54 Oluwatoyin Banales,MD

## 2020-01-25 NOTE — Patient Instructions (Addendum)
Medication Instructions:  Your physician recommends that you continue on your current medications as directed. Please refer to the Current Medication list given to you today.  EAT more SALT INCREASE your FLUID intake  Labwork: None ordered.  Testing/Procedures: None ordered.  Follow-Up: Your physician wants you to follow-up in: as needed with Dr. Ladona Ridgel.    Any Other Special Instructions Will Be Listed Below (If Applicable).  If you need a refill on your cardiac medications before your next appointment, please call your pharmacy.

## 2020-01-26 ENCOUNTER — Ambulatory Visit (INDEPENDENT_AMBULATORY_CARE_PROVIDER_SITE_OTHER): Payer: BC Managed Care – PPO | Admitting: Podiatry

## 2020-01-26 ENCOUNTER — Encounter: Payer: Self-pay | Admitting: Podiatry

## 2020-01-26 ENCOUNTER — Other Ambulatory Visit: Payer: Self-pay | Admitting: Podiatry

## 2020-01-26 ENCOUNTER — Ambulatory Visit (INDEPENDENT_AMBULATORY_CARE_PROVIDER_SITE_OTHER): Payer: BC Managed Care – PPO

## 2020-01-26 DIAGNOSIS — M79671 Pain in right foot: Secondary | ICD-10-CM

## 2020-01-26 DIAGNOSIS — M722 Plantar fascial fibromatosis: Secondary | ICD-10-CM

## 2020-01-26 DIAGNOSIS — M79672 Pain in left foot: Secondary | ICD-10-CM

## 2020-01-26 DIAGNOSIS — M7671 Peroneal tendinitis, right leg: Secondary | ICD-10-CM

## 2020-01-26 MED ORDER — DICLOFENAC SODIUM 75 MG PO TBEC: 75.0000 mg | DELAYED_RELEASE_TABLET | Freq: Two times a day (BID) | ORAL | 2 refills | Status: DC

## 2020-01-30 NOTE — Addendum Note (Signed)
Addended by: Ambrose Wile H on: 01/30/2020 05:19 PM   Modules accepted: Orders  

## 2020-01-31 NOTE — Progress Notes (Signed)
Subjective:   Patient ID: Linda Underwood, female   DOB: 49 y.o.   MRN: [id number]   HPI Patient is found to have pain in both feet but states the right foot is hurting her worse than the left and its been present for several months.  Patient states that it is moderately intense and is making walking difficult and has become gradually more intense over the last several months.  Patient states it hurts on the inside and now the outside of the right over left foot and patient does not smoke likes to be active   Review of Systems  All other systems reviewed and are negative.       Objective:  Physical Exam Vitals and nursing note reviewed.  Constitutional:      Appearance: She is well-developed.  Pulmonary:     Effort: Pulmonary effort is normal.  Musculoskeletal:        General: Normal range of motion.  Skin:    General: Skin is warm.  Neurological:     Mental Status: She is alert.     Neurovascular status was found to be intact muscle strength was found to be adequate range of motion was adequate and patient was noted to have moderate equinus condition.  Patient does have discomfort in the mid arch area right over left with inflammation fluid of the medial band of the fascia and is also noted to have discomfort in the peroneal tendon right at the insertion base of fifth metatarsal with no indications of muscle strength loss or muscle strength compromise.  Patient has good digital perfusion well oriented x3     Assessment:  Acute plantar fasciitis right mid arch along with peroneal tendinitis right     Plan:  H&P reviewed condition.  I discussed the causes of this and at this point organ to focus on her right foot and I did go ahead and I did sterile prep and injected the mid arch right 3 mg dexamethasone Kenalog 5 mg Xylocaine in the peroneal insertion right fifth metatarsal after sterile prep 3 mg dexamethasone Kenalog 5 mg Xylocaine applied fascial brace placed on diclofenac 75 mg  twice daily discussed shoe gear modifications and stretching exercises.  Reappoint recheck 2 weeks or earlier if needed  X-rays indicate that there is moderate depression of the arch with no indication stress fracture arthritis

## 2020-02-13 ENCOUNTER — Ambulatory Visit: Payer: BC Managed Care – PPO | Admitting: Podiatry

## 2020-03-09 ENCOUNTER — Ambulatory Visit: Payer: BC Managed Care – PPO | Admitting: Podiatry

## 2020-03-19 DIAGNOSIS — F3181 Bipolar II disorder: Secondary | ICD-10-CM | POA: Insufficient documentation

## 2020-03-20 NOTE — Progress Notes (Deleted)
Cardiology Office Note:    Date:  03/20/2020   ID:  Linda Underwood, DOB December 03, 1970, MRN [id number]  PCP:  Sharlene Dory, DO  Cardiologist:  Norman Herrlich, MD    Referring MD: Sharlene Dory*    ASSESSMENT:    No diagnosis found. PLAN:    In order of problems listed above:  1. ***   Next appointment: ***   Medication Adjustments/Labs and Tests Ordered: Current medicines are reviewed at length with the patient today.  Concerns regarding medicines are outlined above.  No orders of the defined types were placed in this encounter.  No orders of the defined types were placed in this encounter.   No chief complaint on file.   History of Present Illness:    Linda Underwood is a 49 y.o. female with a hx of palpitation, Mobitz 1 second-degree AV block and ongoing lightheadedness.  Last seen 01/11/2009 2021. Compliance with diet, lifestyle and medications: *** Past Medical History:  Diagnosis Date  . Bipolar 2 disorder (HCC)   . 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 Encounter for counseling 02/12/2018  . Lightheadedness 01/25/2020  . Mobitz type 2 second degree atrioventricular block 01/20/2020  . Onycholysis 02/12/2018  . Palpitations 01/25/2020  . Postural dizziness with presyncope 01/20/2020  . PVC (premature ventricular contraction) 01/20/2020    Past Surgical History:  Procedure Laterality Date  . HAND SURGERY Left     Current Medications: No outpatient medications have been marked as taking for the 03/21/20 encounter (Appointment) with Baldo Daub, MD.     Allergies:   Patient has no known allergies.   Social History   Socioeconomic History  . Marital status: Married    Spouse name: Not on file  . Number of children: Not on file  . Years of education: Not on file  . Highest education level: Not on file  Occupational History  . Not on file  Tobacco Use  . Smoking status: Former Smoker    Types: Cigarettes  . Smokeless tobacco: Never Used   Vaping Use  . Vaping Use: Never used  Substance and Sexual Activity  . Alcohol use: No  . Drug use: No  . Sexual activity: Yes    Partners: Male  Other Topics Concern  . Not on file  Social History Narrative  . Not on file   Social Determinants of Health   Financial Resource Strain:   . Difficulty of Paying Living Expenses: Not on file  Food Insecurity:   . Worried About Programme researcher, broadcasting/film/video in the Last Year: Not on file  . Ran Out of Food in the Last Year: Not on file  Transportation Needs:   . Lack of Transportation (Medical): Not on file  . Lack of Transportation (Non-Medical): Not on file  Physical Activity:   . Days of Exercise per Week: Not on file  . Minutes of Exercise per Session: Not on file  Stress:   . Feeling of Stress : Not on file  Social Connections:   . Frequency of Communication with Friends and Family: Not on file  . Frequency of Social Gatherings with Friends and Family: Not on file  . Attends Religious Services: Not on file  . Active Member of Clubs or Organizations: Not on file  . Attends Banker Meetings: Not on file  . Marital Status: Not on file     Family History: The patient's ***family history includes Peripheral Artery Disease in her father and paternal grandmother.  ROS:   Please see the history of present illness.    All other systems reviewed and are negative.  EKGs/Labs/Other Studies Reviewed:    The following studies were reviewed today:  EKG:  EKG ordered today and personally reviewed.  The ekg ordered today demonstrates ***  Recent Labs: 11/21/2019: ALT 20; BUN 12; Creatinine, Ser 0.81; Hemoglobin 13.2; Platelets 292.0; Potassium 4.1; Sodium 140; TSH 4.38  Recent Lipid Panel    Component Value Date/Time   CHOL 220 (H) 01/06/2020 0900   TRIG 124 01/06/2020 0900   HDL 55 01/06/2020 0900   CHOLHDL 4.0 01/06/2020 0900   VLDL 25.6 11/21/2019 0735   LDLCALC 140 (H) 01/06/2020 0900    Physical Exam:    VS:  There  were no vitals taken for this visit.    Wt Readings from Last 3 Encounters:  01/25/20 138 lb (62.6 kg)  01/20/20 139 lb (63 kg)  01/06/20 142 lb 2 oz (64.5 kg)     GEN: *** Well nourished, well developed in no acute distress HEENT: Normal NECK: No JVD; No carotid bruits LYMPHATICS: No lymphadenopathy CARDIAC: ***RRR, no murmurs, rubs, gallops RESPIRATORY:  Clear to auscultation without rales, wheezing or rhonchi  ABDOMEN: Soft, non-tender, non-distended MUSCULOSKELETAL:  No edema; No deformity  SKIN: Warm and dry NEUROLOGIC:  Alert and oriented x 3 PSYCHIATRIC:  Normal affect    Signed, Norman Herrlich, MD  03/20/2020 1:29 PM    [facility]

## 2020-03-21 ENCOUNTER — Ambulatory Visit: Payer: BC Managed Care – PPO | Admitting: Cardiology

## 2020-03-25 NOTE — Progress Notes (Deleted)
Cardiology Office Note:    Date:  03/25/2020   ID:  Linda Underwood, DOB Feb 03, 1971, MRN [id number]  PCP:  Linda Dory, DO  Cardiologist:  Linda Herrlich, MD    Referring MD: Linda Underwood*    ASSESSMENT:    No diagnosis found. PLAN:    In order of problems listed above:  1. ***   Next appointment: ***   Medication Adjustments/Labs and Tests Ordered: Current medicines are reviewed at length with the patient today.  Concerns regarding medicines are outlined above.  No orders of the defined types were placed in this encounter.  No orders of the defined types were placed in this encounter.   No chief complaint on file.   History of Present Illness:    Linda Underwood is a 49 y.o. female with a hx of palpitation with an en route event monitor except for one episode of Mobitz 1 second-degree AV block with a 2-second pause last seen 01/20/2020.  With ongoing episodes she is referred for EP consultation Dr. Sharrell Ku 01/25/2020 who did not think she required antiarrhythmic drug or further monitoring.  He did note that she was having chest discomfort and elected to defer consideration of stress testing or cardiac CTA to the office follow-up with me.  Her last 2 office visits blood pressure is 91/57 and 94/64. Past Medical History:  Diagnosis Date  . Bipolar 2 disorder (HCC)   . 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 Encounter for counseling 02/12/2018  . Lightheadedness 01/25/2020  . Mobitz type 2 second degree atrioventricular block 01/20/2020  . Onycholysis 02/12/2018  . Palpitations 01/25/2020  . Postural dizziness with presyncope 01/20/2020  . PVC (premature ventricular contraction) 01/20/2020    Past Surgical History:  Procedure Laterality Date  . HAND SURGERY Left     Current Medications: No outpatient medications have been marked as taking for the 03/26/20 encounter (Appointment) with Baldo Daub, MD.     Allergies:   Patient has no known  allergies.   Social History   Socioeconomic History  . Marital status: Married    Spouse name: Not on file  . Number of children: Not on file  . Years of education: Not on file  . Highest education level: Not on file  Occupational History  . Not on file  Tobacco Use  . Smoking status: Former Smoker    Types: Cigarettes  . Smokeless tobacco: Never Used  Vaping Use  . Vaping Use: Never used  Substance and Sexual Activity  . Alcohol use: No  . Drug use: No  . Sexual activity: Yes    Partners: Male  Other Topics Concern  . Not on file  Social History Narrative  . Not on file   Social Determinants of Health   Financial Resource Strain:   . Difficulty of Paying Living Expenses: Not on file  Food Insecurity:   . Worried About Programme researcher, broadcasting/film/video in the Last Year: Not on file  . Ran Out of Food in the Last Year: Not on file  Transportation Needs:   . Lack of Transportation (Medical): Not on file  . Lack of Transportation (Non-Medical): Not on file  Physical Activity:   . Days of Exercise per Week: Not on file  . Minutes of Exercise per Session: Not on file  Stress:   . Feeling of Stress : Not on file  Social Connections:   . Frequency of Communication with Friends and Family: Not on file  .  Frequency of Social Gatherings with Friends and Family: Not on file  . Attends Religious Services: Not on file  . Active Member of Clubs or Organizations: Not on file  . Attends Banker Meetings: Not on file  . Marital Status: Not on file     Family History: The patient's ***family history includes Peripheral Artery Disease in her father and paternal grandmother. ROS:   Please see the history of present illness.    All other systems reviewed and are negative.  EKGs/Labs/Other Studies Reviewed:    The following studies were reviewed today:  EKG:  EKG ordered today and personally reviewed.  The ekg ordered today demonstrates ***  Recent Labs: 11/21/2019: ALT 20;  BUN 12; Creatinine, Ser 0.81; Hemoglobin 13.2; Platelets 292.0; Potassium 4.1; Sodium 140; TSH 4.38  Recent Lipid Panel    Component Value Date/Time   CHOL 220 (H) 01/06/2020 0900   TRIG 124 01/06/2020 0900   HDL 55 01/06/2020 0900   CHOLHDL 4.0 01/06/2020 0900   VLDL 25.6 11/21/2019 0735   LDLCALC 140 (H) 01/06/2020 0900    Physical Exam:    VS:  There were no vitals taken for this visit.    Wt Readings from Last 3 Encounters:  01/25/20 138 lb (62.6 kg)  01/20/20 139 lb (63 kg)  01/06/20 142 lb 2 oz (64.5 kg)     GEN: *** Well nourished, well developed in no acute distress HEENT: Normal NECK: No JVD; No carotid bruits LYMPHATICS: No lymphadenopathy CARDIAC: ***RRR, no murmurs, rubs, gallops RESPIRATORY:  Clear to auscultation without rales, wheezing or rhonchi  ABDOMEN: Soft, non-tender, non-distended MUSCULOSKELETAL:  No edema; No deformity  SKIN: Warm and dry NEUROLOGIC:  Alert and oriented x 3 PSYCHIATRIC:  Normal affect    Signed, Linda Herrlich, MD  03/25/2020 12:06 PM    [facility]

## 2020-03-26 ENCOUNTER — Ambulatory Visit: Payer: BC Managed Care – PPO | Admitting: Cardiology

## 2020-04-10 ENCOUNTER — Telehealth: Payer: Self-pay | Admitting: *Deleted

## 2020-04-10 ENCOUNTER — Other Ambulatory Visit: Payer: BC Managed Care – PPO

## 2020-04-10 NOTE — Telephone Encounter (Signed)
Called patient to reschedule and she stated that she was not sure when she was going to get car fixed.  She will call back to reschedule.

## 2020-04-10 NOTE — Telephone Encounter (Signed)
Reason for Call Request to [facility] Appointment Initial Comment Caller states that she has an appointment with the lab at [facility] and she is needing to cancel the appointment due to car trouble. Additional Comment Caller states that she would like a message to be sent to cancel the lab appointment that she has for today. She would like a call back from the office to reschedule the lab appointment.

## 2020-05-07 ENCOUNTER — Encounter: Payer: BC Managed Care – PPO | Admitting: Plastic Surgery

## 2020-05-10 ENCOUNTER — Other Ambulatory Visit: Payer: Self-pay | Admitting: Podiatry

## 2020-05-10 DIAGNOSIS — M722 Plantar fascial fibromatosis: Secondary | ICD-10-CM

## 2020-05-15 ENCOUNTER — Encounter: Payer: BC Managed Care – PPO | Admitting: Plastic Surgery

## 2020-08-28 ENCOUNTER — Encounter: Payer: Self-pay | Admitting: Plastic Surgery

## 2020-08-28 ENCOUNTER — Ambulatory Visit (INDEPENDENT_AMBULATORY_CARE_PROVIDER_SITE_OTHER): Payer: Self-pay | Admitting: Plastic Surgery

## 2020-08-28 ENCOUNTER — Other Ambulatory Visit: Payer: Self-pay

## 2020-08-28 VITALS — BP 121/75 | HR 84

## 2020-08-28 DIAGNOSIS — Z719 Counseling, unspecified: Secondary | ICD-10-CM

## 2020-08-28 NOTE — Progress Notes (Signed)

## 2020-10-01 ENCOUNTER — Other Ambulatory Visit: Payer: BC Managed Care – PPO | Admitting: Plastic Surgery

## 2020-10-15 ENCOUNTER — Encounter: Payer: Self-pay | Admitting: Family Medicine

## 2020-10-15 ENCOUNTER — Other Ambulatory Visit: Payer: Self-pay

## 2020-10-15 ENCOUNTER — Ambulatory Visit (INDEPENDENT_AMBULATORY_CARE_PROVIDER_SITE_OTHER): Payer: BC Managed Care – PPO | Admitting: Family Medicine

## 2020-10-15 VITALS — BP 110/68 | HR 71 | Temp 98.6°F | Ht 61.0 in | Wt 134.2 lb

## 2020-10-15 DIAGNOSIS — M25511 Pain in right shoulder: Secondary | ICD-10-CM

## 2020-10-15 DIAGNOSIS — S46811A Strain of other muscles, fascia and tendons at shoulder and upper arm level, right arm, initial encounter: Secondary | ICD-10-CM | POA: Diagnosis not present

## 2020-10-15 MED ORDER — CYCLOBENZAPRINE HCL 10 MG PO TABS: 5.0000 mg | ORAL_TABLET | Freq: Three times a day (TID) | ORAL | 0 refills | Status: DC | PRN

## 2020-10-15 MED ORDER — MELOXICAM 15 MG PO TABS: 15.0000 mg | ORAL_TABLET | Freq: Every day | ORAL | 0 refills | Status: DC

## 2020-10-15 NOTE — Patient Instructions (Addendum)
Heat (pad or rice pillow in microwave) over affected area, 10-15 minutes twice daily.   Ice/cold pack over area for 10-15 min twice daily.  OK to take Tylenol 1000 mg (2 extra strength tabs) or 975 mg (3 regular strength tabs) every 6 hours as needed.  Take Flexeril (cyclobenzaprine) 1-2 hours before planned bedtime. If it makes you drowsy, do not take during the day. You can try half a tab the following night.  Let us know if you need anything.  Trapezius stretches/exercises Do exercises exactly as told by your health care provider and adjust them as directed. It is normal to feel mild stretching, pulling, tightness, or discomfort as you do these exercises, but you should stop right away if you feel sudden pain or your pain gets worse.  Stretching and range of motion exercises These exercises warm up your muscles and joints and improve the movement and flexibility of your shoulder. These exercises can also help to relieve pain, numbness, and tingling. If you are unable to do any of the following for any reason, do not further attempt to do it.   Exercise A: Flexion, standing    1. Stand and hold a broomstick, a cane, or a similar object. Place your hands a little more than shoulder-width apart on the object. Your left / right hand should be palm-up, and your other hand should be palm-down. 2. Push the stick to raise your left / right arm out to your side and then over your head. Use your other hand to help move the stick. Stop when you feel a stretch in your shoulder, or when you reach the angle that is recommended by your health care provider. ? Avoid shrugging your shoulder while you raise your arm. Keep your shoulder blade tucked down toward your spine. 3. Hold for 30 seconds. 4. Slowly return to the starting position. Repeat 2 times. Complete this exercise 3 times per week.  Exercise B: Abduction, supine    1. Lie on your back and hold a broomstick, a cane, or a similar object.  Place your hands a little more than shoulder-width apart on the object. Your left / right hand should be palm-up, and your other hand should be palm-down. 2. Push the stick to raise your left / right arm out to your side and then over your head. Use your other hand to help move the stick. Stop when you feel a stretch in your shoulder, or when you reach the angle that is recommended by your health care provider. ? Avoid shrugging your shoulder while you raise your arm. Keep your shoulder blade tucked down toward your spine. 3. Hold for 30 seconds. 4. Slowly return to the starting position. Repeat 2 times. Complete this exercise 3 times per week.  Exercise C: Flexion, active-assisted    1. Lie on your back. You may bend your knees for comfort. 2. Hold a broomstick, a cane, or a similar object. Place your hands about shoulder-width apart on the object. Your palms should face toward your feet. 3. Raise the stick and move your arms over your head and behind your head, toward the floor. Use your healthy arm to help your left / right arm move farther. Stop when you feel a gentle stretch in your shoulder, or when you reach the angle where your health care provider tells you to stop. 4. Hold for 30 seconds. 5. Slowly return to the starting position. Repeat 2 times. Complete this exercise 3 times per week.  Exercise D: External rotation and abduction    1. Stand in a door frame with one of your feet slightly in front of the other. This is called a staggered stance. 2. Choose one of the following positions as told by your health care provider: ? Place your hands and forearms on the door frame above your head. ? Place your hands and forearms on the door frame at the height of your head. ? Place your hands on the door frame at the height of your elbows. 3. Slowly move your weight onto your front foot until you feel a stretch across your chest and in the front of your shoulders. Keep your head and chest  upright and keep your abdominal muscles tight. 4. Hold for 30 seconds. 5. To release the stretch, shift your weight to your back foot. Repeat 2 times. Complete this stretch 3 times per week.  Strengthening exercises These exercises build strength and endurance in your shoulder. Endurance is the ability to use your muscles for a long time, even after your muscles get tired. Exercise E: Scapular depression and adduction  1. Sit on a stable chair. Support your arms in front of you with pillows, armrests, or a tabletop. Keep your elbows in line with the sides of your body. 2. Gently move your shoulder blades down toward your middle back. Relax the muscles on the tops of your shoulders and in the back of your neck. 3. Hold for 3 seconds. 4. Slowly release the tension and relax your muscles completely before doing this exercise again. Repeat for a total of 10 repetitions. 5. After you have practiced this exercise, try doing the exercise without the arm support. Then, try the exercise while standing instead of sitting. Repeat 2 times. Complete this exercise 3 times per week.  Exercise F: Shoulder abduction, isometric    1. Stand or sit about 4-6 inches (10-15 cm) from a wall with your left / right side facing the wall. 2. Bend your left / right elbow and gently press your elbow against the wall. 3. Increase the pressure slowly until you are pressing as hard as you can without shrugging your shoulder. 4. Hold for 3 seconds. 5. Slowly release the tension and relax your muscles completely. Repeat for a total of 10 repetitions. Repeat 2 times. Complete this exercise 3 times per week.  Exercise G: Shoulder flexion, isometric    1. Stand or sit about 4-6 inches (10-15 cm) away from a wall with your left / right side facing the wall. 2. Keep your left / right elbow straight and gently press the top of your fist against the wall. Increase the pressure slowly until you are pressing as hard as you can  without shrugging your shoulder. 3. Hold for 10-15 seconds. 4. Slowly release the tension and relax your muscles completely. Repeat for a total of 10 repetitions. Repeat 2 times. Complete this exercise 3 times per week.  Exercise H: Internal rotation    1. Sit in a stable chair without armrests, or stand. Secure an exercise band at your left / right side, at elbow height. 2. Place a soft object, such as a folded towel or a small pillow, under your left / right upper arm so your elbow is a few inches (about 8 cm) away from your side. 3. Hold the end of the exercise band so the band stretches. 4. Keeping your elbow pressed against the soft object under your arm, move your forearm across your body toward  your abdomen. Keep your body steady so the movement is only coming from your shoulder. 5. Hold for 3 seconds. 6. Slowly return to the starting position. Repeat for a total of 10 repetitions. Repeat 2 times. Complete this exercise 3 times per week.  Exercise I: External rotation    1. Sit in a stable chair without armrests, or stand. 2. Secure an exercise band at your left / right side, at elbow height. 3. Place a soft object, such as a folded towel or a small pillow, under your left / right upper arm so your elbow is a few inches (about 8 cm) away from your side. 4. Hold the end of the exercise band so the band stretches. 5. Keeping your elbow pressed against the soft object under your arm, move your forearm out, away from your abdomen. Keep your body steady so the movement is only coming from your shoulder. 6. Hold for 3 seconds. 7. Slowly return to the starting position. Repeat for a total of 10 repetitions. Repeat 2 times. Complete this exercise 3 times per week. Exercise J: Shoulder extension  1. Sit in a stable chair without armrests, or stand. Secure an exercise band to a stable object in front of you so the band is at shoulder height. 2. Hold one end of the exercise band in each  hand. Your palms should face each other. 3. Straighten your elbows and lift your hands up to shoulder height. 4. Step back, away from the secured end of the exercise band, until the band stretches. 5. Squeeze your shoulder blades together and pull your hands down to the sides of your thighs. Stop when your hands are straight down by your sides. Do not let your hands go behind your body. 6. Hold for 3 seconds. 7. Slowly return to the starting position. Repeat for a total of 10 repetitions. Repeat 2 times. Complete this exercise 3 times per week.  Exercise K: Shoulder extension, prone    1. Lie on your abdomen on a firm surface so your left / right arm hangs over the edge. 2. Hold a 5 lb weight in your hand so your palm faces in toward your body. Your arm should be straight. 3. Squeeze your shoulder blade down toward the middle of your back. 4. Slowly raise your arm behind you, up to the height of the surface that you are lying on. Keep your arm straight. 5. Hold for 3 seconds. 6. Slowly return to the starting position and relax your muscles. Repeat for a total of 10 repetitions. Repeat 2 times. Complete this exercise 3 times per week.   Exercise L: Horizontal abduction, prone  1. Lie on your abdomen on a firm surface so your left / right arm hangs over the edge. 2. Hold a 5 lb weight in your hand so your palm faces toward your feet. Your arm should be straight. 3. Squeeze your shoulder blade down toward the middle of your back. 4. Bend your elbow so your hand moves up, until your elbow is bent to an "L" shape (90 degrees). With your elbow bent, slowly move your forearm forward and up. Raise your hand up to the height of the surface that you are lying on. ? Your upper arm should not move, and your elbow should stay bent. ? At the top of the movement, your palm should face the floor. 5. Hold for 3 seconds. 6. Slowly return to the starting position and relax your muscles. Repeat for a total  of 10 repetitions. Repeat 2 times. Complete this exercise 3 times per week.  Exercise M: Horizontal abduction, standing  1. Sit on a stable chair, or stand. 2. Secure an exercise band to a stable object in front of you so the band is at shoulder height. 3. Hold one end of the exercise band in each hand. 4. Straighten your elbows and lift your hands straight in front of you, up to shoulder height. Your palms should face down, toward the floor. 5. Step back, away from the secured end of the exercise band, until the band stretches. 6. Move your arms out to your sides, and keep your arms straight. 7. Hold for 3 seconds. 8. Slowly return to the starting position. Repeat for a total of 10 repetitions. Repeat 2 times. Complete this exercise 3 times per week.  Exercise N: Scapular retraction and elevation  1. Sit on a stable chair, or stand. 2. Secure an exercise band to a stable object in front of you so the band is at shoulder height. 3. Hold one end of the exercise band in each hand. Your palms should face each other. 4. Sit in a stable chair without armrests, or stand. 5. Step back, away from the secured end of the exercise band, until the band stretches. 6. Squeeze your shoulder blades together and lift your hands over your head. Keep your elbows straight. 7. Hold for 3 seconds. 8. Slowly return to the starting position. Repeat for a total of 10 repetitions. Repeat 2 times. Complete this exercise 3 times per week.  This information is not intended to replace advice given to you by your health care provider. Make sure you discuss any questions you have with your health care provider. Document Released: 04/28/2005 Document Revised: 01/03/2016 Document Reviewed: 03/15/2015 Elsevier Interactive Patient Education  2017 [employer]  EXERCISES  RANGE OF MOTION (ROM) AND STRETCHING EXERCISES These exercises may help you when beginning to rehabilitate your injury. While completing these  exercises, remember:   Restoring tissue flexibility helps normal motion to return to the joints. This allows healthier, less painful movement and activity.  An effective stretch should be held for at least 30 seconds.  A stretch should never be painful. You should only feel a gentle lengthening or release in the stretched tissue.  ROM - Pendulum  Bend at the waist so that your right / left arm falls away from your body. Support yourself with your opposite hand on a solid surface, such as a table or a countertop.  Your right / left arm should be perpendicular to the ground. If it is not perpendicular, you need to lean over farther. Relax the muscles in your right / left arm and shoulder as much as possible.  Gently sway your hips and trunk so they move your right / left arm without any use of your right / left shoulder muscles.  Progress your movements so that your right / left arm moves side to side, then forward and backward, and finally, both clockwise and counterclockwise.  Complete 10-15 repetitions in each direction. Many people use this exercise to relieve discomfort in their shoulder as well as to gain range of motion. Repeat 2 times. Complete this exercise 3 times per week.  STRETCH - Flexion, Standing  Stand with good posture. With an underhand grip on your right / left hand and an overhand grip on the opposite hand, grasp a broomstick or cane so that your hands are a little more than shoulder-width apart.  Keeping your right / left elbow straight and shoulder muscles relaxed, push the stick with your opposite hand to raise your right / left arm in front of your body and then overhead. Raise your arm until you feel a stretch in your right / left shoulder, but before you have increased shoulder pain.  Try to avoid shrugging your right / left shoulder as your arm rises by keeping your shoulder blade tucked down and toward your mid-back spine. Hold 30 seconds.  Slowly return to the  starting position. Repeat 2 times. Complete this exercise 3 times per week.  STRETCH - Internal Rotation  Place your right / left hand behind your back, palm-up.  Throw a towel or belt over your opposite shoulder. Grasp the towel/belt with your right / left hand.  While keeping an upright posture, gently pull up on the towel/belt until you feel a stretch in the front of your right / left shoulder.  Avoid shrugging your right / left shoulder as your arm rises by keeping your shoulder blade tucked down and toward your mid-back spine.  Hold 30. Release the stretch by lowering your opposite hand. Repeat 2 times. Complete this exercise 3 times per week.  STRETCH - External Rotation and Abduction  Stagger your stance through a doorframe. It does not matter which foot is forward.  As instructed by your physician, physical therapist or athletic trainer, place your hands: ? And forearms above your head and on the door frame. ? And forearms at head-height and on the door frame. ? At elbow-height and on the door frame.  Keeping your head and chest upright and your stomach muscles tight to prevent over-extending your low-back, slowly shift your weight onto your front foot until you feel a stretch across your chest and/or in the front of your shoulders.  Hold 30 seconds. Shift your weight to your back foot to release the stretch. Repeat 2 times. Complete this stretch 3 times per week.   STRENGTHENING EXERCISES  These exercises may help you when beginning to rehabilitate your injury. They may resolve your symptoms with or without further involvement from your physician, physical therapist or athletic trainer. While completing these exercises, remember:   Muscles can gain both the endurance and the strength needed for everyday activities through controlled exercises.  Complete these exercises as instructed by your physician, physical therapist or athletic trainer. Progress the resistance and  repetitions only as guided.  You may experience muscle soreness or fatigue, but the pain or discomfort you are trying to eliminate should never worsen during these exercises. If this pain does worsen, stop and make certain you are following the directions exactly. If the pain is still present after adjustments, discontinue the exercise until you can discuss the trouble with your clinician.  If advised by your physician, during your recovery, avoid activity or exercises which involve actions that place your right / left hand or elbow above your head or behind your back or head. These positions stress the tissues which are trying to heal.  STRENGTH - Scapular Depression and Adduction  With good posture, sit on a firm chair. Supported your arms in front of you with pillows, arm rests or a table top. Have your elbows in line with the sides of your body.  Gently draw your shoulder blades down and toward your mid-back spine. Gradually increase the tension without tensing the muscles along the top of your shoulders and the back of your neck.  Hold for 3 seconds. Slowly  release the tension and relax your muscles completely before completing the next repetition.  After you have practiced this exercise, remove the arm support and complete it in standing as well as sitting. Repeat 2 times. Complete this exercise 3 times per week.   STRENGTH - External Rotators  Secure a rubber exercise band/tubing to a fixed object so that it is at the same height as your right / left elbow when you are standing or sitting on a firm surface.  Stand or sit so that the secured exercise band/tubing is at your side that is not injured.  Bend your elbow 90 degrees. Place a folded towel or small pillow under your right / left arm so that your elbow is a few inches away from your side.  Keeping the tension on the exercise band/tubing, pull it away from your body, as if pivoting on your elbow. Be sure to keep your body steady  so that the movement is only coming from your shoulder rotating.  Hold 3 seconds. Release the tension in a controlled manner as you return to the starting position. Repeat 2 times. Complete this exercise 3 times per week.   STRENGTH - Supraspinatus  Stand or sit with good posture. Grasp a 2-3 lb weight or an exercise band/tubing so that your hand is "thumbs-up," like when you shake hands.  Slowly lift your right / left hand from your thigh into the air, traveling about 30 degrees from straight out at your side. Lift your hand to shoulder height or as far as you can without increasing any shoulder pain. Initially, many people do not lift their hands above shoulder height.  Avoid shrugging your right / left shoulder as your arm rises by keeping your shoulder blade tucked down and toward your mid-back spine.  Hold for 3 seconds. Control the descent of your hand as you slowly return to your starting position. Repeat 2 times. Complete this exercise 3 times per week.   STRENGTH - Shoulder Extensors  Secure a rubber exercise band/tubing so that it is at the height of your shoulders when you are either standing or sitting on a firm arm-less chair.  With a thumbs-up grip, grasp an end of the band/tubing in each hand. Straighten your elbows and lift your hands straight in front of you at shoulder height. Step back away from the secured end of band/tubing until it becomes tense.  Squeezing your shoulder blades together, pull your hands down to the sides of your thighs. Do not allow your hands to go behind you.  Hold for 3 seconds. Slowly ease the tension on the band/tubing as you reverse the directions and return to the starting position. Repeat 2 times. Complete this exercise 3 times per week.   STRENGTH - Scapular Retractors  Secure a rubber exercise band/tubing so that it is at the height of your shoulders when you are either standing or sitting on a firm arm-less chair.  With a palm-down  grip, grasp an end of the band/tubing in each hand. Straighten your elbows and lift your hands straight in front of you at shoulder height. Step back away from the secured end of band/tubing until it becomes tense.  Squeezing your shoulder blades together, draw your elbows back as you bend them. Keep your upper arm lifted away from your body throughout the exercise.  Hold 3 seconds. Slowly ease the tension on the band/tubing as you reverse the directions and return to the starting position. Repeat 2 times. Complete this  exercise 3 times per week.  STRENGTH - Scapular Depressors  Find a sturdy chair without wheels, such as a from a dining room table.  Keeping your feet on the floor, lift your bottom from the seat and lock your elbows.  Keeping your elbows straight, allow gravity to pull your body weight down. Your shoulders will rise toward your ears.  Raise your body against gravity by drawing your shoulder blades down your back, shortening the distance between your shoulders and ears. Although your feet should always maintain contact with the floor, your feet should progressively support less body weight as you get stronger.  Hold 3 seconds. In a controlled and slow manner, lower your body weight to begin the next repetition. Repeat 2 times. Complete this exercise 3 times per week.   This information is not intended to replace advice given to you by your health care provider. Make sure you discuss any questions you have with your health care provider.  Document Released: 03/12/2005 Document Revised: 05/19/2014 Document Reviewed: 08/10/2008 Elsevier Interactive Patient Education [employer]

## 2020-10-15 NOTE — Progress Notes (Signed)
Musculoskeletal Exam  Patient: Linda Underwood DOB: 1971-03-20  DOS: 10/15/2020  SUBJECTIVE:  Chief Complaint:   Chief Complaint  Patient presents with  . Neck Pain    Linda Underwood is a 50 y.o.  female for evaluation and treatment of R neck/shoulder pain.   Onset:  4 weeks ago. No inj or change in activity.  Location: R trap region, pts to both SCMs Character:  dull and sharp  Progression of issue:  has worsened Associated symptoms: headaches, no redness, bruising, swelling Treatment: to date has been OTC NSAIDS.   Neurovascular symptoms: no  Past Medical History:  Diagnosis Date  . Bipolar 2 disorder (HCC)   . 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 Encounter for counseling 02/12/2018  . Lightheadedness 01/25/2020  . Mobitz type 2 second degree atrioventricular block 01/20/2020  . Onycholysis 02/12/2018  . Palpitations 01/25/2020  . Postural dizziness with presyncope 01/20/2020  . PVC (premature ventricular contraction) 01/20/2020    Objective: VITAL SIGNS: BP 110/68 (BP Location: Left Arm, Patient Position: Sitting, Cuff Size: Normal)   Pulse 71   Temp 98.6 F (37 C) (Oral)   Ht 5\' 1"  (1.549 m)   Wt 134 lb 4 oz (60.9 kg)   SpO2 97%   BMI 25.37 kg/m  Constitutional: Well formed, well developed. No acute distress. Thorax & Lungs: No accessory muscle use Musculoskeletal: neck/shoulder.   Normal active range of motion: yes.   Normal passive range of motion: yes Tenderness to palpation: yes- lateral neck and trap Deformity: no Ecchymosis: no Tests positive: Neer's, empty can Tests negative: Speed's, Spurling's, cross over, lift off Neurologic: Normal sensory function. No focal deficits noted. DTR's equal and symmetric in UE's. No clonus. Psychiatric: Normal mood. Age appropriate judgment and insight. Alert & oriented x 3.    Assessment:  Strain of right trapezius muscle, initial encounter - Plan: meloxicam (MOBIC) 15 MG tablet, cyclobenzaprine (FLEXERIL) 10 MG  tablet  Acute pain of right shoulder - Plan: meloxicam (MOBIC) 15 MG tablet, cyclobenzaprine (FLEXERIL) 10 MG tablet  Plan: Stretches/exercises, heat, ice, Tylenol. Warnings about Flexeril verbalized and written down. PT if no better in the next 2-3 weeks.  F/u as originally scheduled or prn. The patient voiced understanding and agreement to the plan.   [place], DO 10/15/20  2:43 PM

## 2020-11-12 ENCOUNTER — Other Ambulatory Visit: Payer: Self-pay | Admitting: Family Medicine

## 2020-11-12 DIAGNOSIS — S46811A Strain of other muscles, fascia and tendons at shoulder and upper arm level, right arm, initial encounter: Secondary | ICD-10-CM

## 2020-11-12 DIAGNOSIS — M25511 Pain in right shoulder: Secondary | ICD-10-CM

## 2020-12-12 ENCOUNTER — Other Ambulatory Visit: Payer: Self-pay | Admitting: Family Medicine

## 2020-12-12 DIAGNOSIS — S46811A Strain of other muscles, fascia and tendons at shoulder and upper arm level, right arm, initial encounter: Secondary | ICD-10-CM

## 2020-12-12 DIAGNOSIS — M25511 Pain in right shoulder: Secondary | ICD-10-CM

## 2021-01-11 ENCOUNTER — Other Ambulatory Visit: Payer: Self-pay | Admitting: Family Medicine

## 2021-01-11 DIAGNOSIS — M25511 Pain in right shoulder: Secondary | ICD-10-CM

## 2021-01-11 DIAGNOSIS — S46811A Strain of other muscles, fascia and tendons at shoulder and upper arm level, right arm, initial encounter: Secondary | ICD-10-CM

## 2021-01-29 ENCOUNTER — Other Ambulatory Visit: Payer: Self-pay | Admitting: Family Medicine

## 2021-01-29 ENCOUNTER — Telehealth: Payer: Self-pay | Admitting: Family Medicine

## 2021-01-29 DIAGNOSIS — Z0000 Encounter for general adult medical examination without abnormal findings: Secondary | ICD-10-CM

## 2021-01-29 NOTE — Telephone Encounter (Signed)
CMP, CBC, lipid panel, A1c.

## 2021-01-29 NOTE — Telephone Encounter (Signed)
Orders labs.

## 2021-01-29 NOTE — Telephone Encounter (Signed)
The patient would like to order a lipid test?

## 2021-01-29 NOTE — Telephone Encounter (Signed)
I called and scheduled her CPE for 02/04/21. Also scheduled a LAB appt for 01/31/21 (her request before appt on 02/04/21). Let me know what labs to order???

## 2021-01-29 NOTE — Telephone Encounter (Signed)
Plz sched CPE which she is due for, we can order then. Ty.

## 2021-01-31 ENCOUNTER — Telehealth: Payer: Self-pay | Admitting: *Deleted

## 2021-01-31 ENCOUNTER — Other Ambulatory Visit: Payer: Self-pay

## 2021-01-31 ENCOUNTER — Other Ambulatory Visit (INDEPENDENT_AMBULATORY_CARE_PROVIDER_SITE_OTHER): Payer: BC Managed Care – PPO

## 2021-01-31 DIAGNOSIS — M2550 Pain in unspecified joint: Secondary | ICD-10-CM

## 2021-01-31 DIAGNOSIS — Z0000 Encounter for general adult medical examination without abnormal findings: Secondary | ICD-10-CM

## 2021-01-31 LAB — CBC
HCT: 39.2 % (ref 36.0–46.0)
Hemoglobin: 12.7 g/dL (ref 12.0–15.0)
MCHC: 32.4 g/dL (ref 30.0–36.0)
MCV: 80.4 fl (ref 78.0–100.0)
Platelets: 280 10*3/uL (ref 150.0–400.0)
RBC: 4.88 Mil/uL (ref 3.87–5.11)
RDW: 13.5 % (ref 11.5–15.5)
WBC: 5 10*3/uL (ref 4.0–10.5)

## 2021-01-31 LAB — COMPREHENSIVE METABOLIC PANEL
ALT: 10 U/L (ref 0–35)
AST: 12 U/L (ref 0–37)
Albumin: 4.4 g/dL (ref 3.5–5.2)
Alkaline Phosphatase: 50 U/L (ref 39–117)
BUN: 15 mg/dL (ref 6–23)
CO2: 30 mEq/L (ref 19–32)
Calcium: 9.4 mg/dL (ref 8.4–10.5)
Chloride: 105 mEq/L (ref 96–112)
Creatinine, Ser: 0.82 mg/dL (ref 0.40–1.20)
GFR: 83.22 mL/min (ref >60.00)
Glucose, Bld: 93 mg/dL (ref 70–99)
Potassium: 4.2 mEq/L (ref 3.5–5.1)
Sodium: 141 mEq/L (ref 135–145)
Total Bilirubin: 0.6 mg/dL (ref 0.2–1.2)
Total Protein: 6.6 g/dL (ref 6.0–8.3)

## 2021-01-31 LAB — HEMOGLOBIN A1C: Hgb A1c MFr Bld: 5.7 % (ref 4.6–6.5)

## 2021-01-31 LAB — LIPID PANEL
Cholesterol: 216 mg/dL — ABNORMAL HIGH (ref 0–200)
HDL: 54.1 mg/dL (ref >39.00)
LDL Cholesterol: 140 mg/dL — ABNORMAL HIGH (ref 0–99)
NonHDL: 162.05
Total CHOL/HDL Ratio: 4
Triglycerides: 112 mg/dL (ref 0.0–149.0)
VLDL: 22.4 mg/dL (ref 0.0–40.0)

## 2021-01-31 LAB — C-REACTIVE PROTEIN: CRP: <1 mg/dL (ref 0.5–20.0)

## 2021-01-31 NOTE — Telephone Encounter (Signed)
Pt came in for labs this morning. States she had asked about doing a CRP level to check for inflammation and was told we could do that today.  What dx code should we use?

## 2021-01-31 NOTE — Addendum Note (Signed)
Addended by: Mervin Kung A on: 01/31/2021 12:03 PM   Modules accepted: Orders

## 2021-01-31 NOTE — Telephone Encounter (Signed)
I spoke with pt. She states she is having pain in fingers of both hands, right foot and shoulder pain and left knee pain. Want to verify that it is ok to use joint pain for the CRP level?

## 2021-01-31 NOTE — Telephone Encounter (Signed)
Patient request for diagnostic lab. Ty.

## 2021-02-04 ENCOUNTER — Ambulatory Visit (INDEPENDENT_AMBULATORY_CARE_PROVIDER_SITE_OTHER): Payer: BC Managed Care – PPO | Admitting: Family Medicine

## 2021-02-04 ENCOUNTER — Other Ambulatory Visit: Payer: Self-pay

## 2021-02-04 ENCOUNTER — Encounter: Payer: Self-pay | Admitting: Family Medicine

## 2021-02-04 VITALS — BP 108/68 | HR 82 | Temp 98.1°F | Ht 61.0 in | Wt 129.5 lb

## 2021-02-04 DIAGNOSIS — Z0000 Encounter for general adult medical examination without abnormal findings: Secondary | ICD-10-CM | POA: Diagnosis not present

## 2021-02-04 DIAGNOSIS — Z1211 Encounter for screening for malignant neoplasm of colon: Secondary | ICD-10-CM

## 2021-02-04 NOTE — Progress Notes (Signed)
Chief Complaint  Patient presents with   Annual Exam    Fasting labs      Well Woman Linda Underwood is here for a complete physical.   Her last physical was >1 year ago.  Current diet: in general, diet is improving. Current exercise: walking. Weight is stable and she denies fatigue out of ordinary. Seatbelt? 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     cholecalciferol (VITAMIN D3) 25 MCG (1000 UNIT) tablet Take 1,000 Units by mouth daily.     clonazePAM (KLONOPIN) 1 MG tablet Take 1 mg by mouth daily.     cyclobenzaprine (FLEXERIL) 10 MG tablet Take 0.5-1 tablets (5-10 mg total) by mouth 3 (three) times daily as needed for muscle spasms. 21 tablet 0   lamoTRIgine (LAMICTAL) 150 MG tablet Take 150 mg by mouth daily.     meloxicam (MOBIC) 15 MG tablet TAKE ONE TABLET BY MOUTH DAILY 30 tablet 0   Multiple Vitamin (MULTIVITAMIN) tablet Take 1 tablet by mouth daily.     vitamin B-12 (CYANOCOBALAMIN) 500 MCG tablet Take 500 mcg by mouth daily.     Zinc 50 MG CAPS Take by mouth daily.     Allergies No Known Allergies  Review of Systems: Constitutional:  no unexpected weight changes Eye:  no recent significant change in vision Ear/Nose/Mouth/Throat:  Ears:  no  recent change in hearing Nose/Mouth/Throat:  no complaints of nasal congestion, no sore throat Cardiovascular: no chest pain Respiratory:  no shortness of breath Gastrointestinal:  no abdominal pain, no change in bowel habits GU:  Female: negative for dysuria or pelvic pain Musculoskeletal/Extremities:  no pain of the joints Integumentary (Skin/Breast):  no abnormal skin lesions reported Neurologic:  no headaches Endocrine:  denies fatigue  Exam BP 108/68   Pulse 82   Temp 98.1 F (36.7 C) (Oral)   Ht 5\' 1"  (1.549 m)   Wt 129 lb 8 oz (58.7 kg)   SpO2 94%   BMI 24.47 kg/m  General:  well developed, well nourished, in no apparent distress Skin:  no significant moles, warts, or growths Head:  no masses, lesions, or tenderness Eyes:  pupils equal and round, sclera anicteric without injection Ears:  canals without lesions, TMs shiny without retraction, no obvious effusion, no erythema Nose:  nares patent, septum midline, mucosa normal, and no drainage or sinus tenderness Throat/Pharynx:  lips and gingiva without lesion; tongue and uvula midline; non-inflamed pharynx; no exudates or postnasal drainage Neck: neck supple without adenopathy, thyromegaly, or masses Lungs:  clear to auscultation, breath sounds equal bilaterally, no respiratory distress Cardio:  regular rate and rhythm, no LE edema Abdomen:  abdomen soft, nontender; bowel sounds normal; no masses or organomegaly Genital: Defer to GYN Musculoskeletal:  symmetrical muscle groups noted without  atrophy or deformity Extremities:  no clubbing, cyanosis, or edema, no deformities, no skin discoloration Neuro:  gait normal; deep tendon reflexes normal and symmetric Psych: well oriented with normal range of affect and appropriate judgment/insight  Assessment and Plan  Well adult exam - Plan: CT CARDIAC SCORING (SELF PAY ONLY)  Screening for colon cancer - Plan: Ambulatory referral to Gastroenterology   Well 50 y.o.  female. Counseled on diet and exercise. Other orders as above. CCS: Refer as above. Shingrix rec'd. Declined flu shot.  CACS as above. Discussed 100-150 out of pocket cost.  Follow up in 1 yr or prn. The patient voiced understanding and agreement to the plan.  Jilda Roche [place], DO 02/04/21 12:16 PM

## 2021-02-04 NOTE — Patient Instructions (Addendum)
Someone will reach out regarding your heart scan.  Keep the diet clean and stay active.  Aim to do some physical exertion for 150 minutes per week. This is typically divided into 5 days per week, 30 minutes per day. The activity should be enough to get your heart rate up. Anything is better than nothing if you have time constraints. Consider yoga, weight resistance exercise, tai chi.   If you do not hear anything about your referral in the next 1-2 weeks, call our office and ask for an update.  The new Shingrix vaccine (for shingles) is a 2 shot series. It can make people feel low energy, achy and almost like they have the flu for 48 hours after injection. Please plan accordingly when deciding on when to get this shot. Call our office for a nurse visit appointment to get this. The second shot of the series is less severe regarding the side effects, but it still lasts 48 hours.   Thank you for getting your mammogram with the GYN team.   Let us know if you need anything.

## 2021-02-12 ENCOUNTER — Telehealth (HOSPITAL_BASED_OUTPATIENT_CLINIC_OR_DEPARTMENT_OTHER): Payer: Self-pay

## 2021-02-14 ENCOUNTER — Other Ambulatory Visit: Payer: Self-pay | Admitting: Family Medicine

## 2021-02-14 DIAGNOSIS — S46811A Strain of other muscles, fascia and tendons at shoulder and upper arm level, right arm, initial encounter: Secondary | ICD-10-CM

## 2021-02-14 DIAGNOSIS — M25511 Pain in right shoulder: Secondary | ICD-10-CM

## 2021-02-19 ENCOUNTER — Other Ambulatory Visit (HOSPITAL_COMMUNITY): Payer: BC Managed Care – PPO

## 2021-02-27 ENCOUNTER — Ambulatory Visit (HOSPITAL_COMMUNITY)
Admission: RE | Admit: 2021-02-27 | Discharge: 2021-02-27 | Disposition: A | Discharge status: Home / Self Care | Payer: BC Managed Care – PPO | Source: Ambulatory Visit | Attending: Family Medicine | Admitting: Family Medicine

## 2021-02-27 DIAGNOSIS — Z0000 Encounter for general adult medical examination without abnormal findings: Secondary | ICD-10-CM | POA: Insufficient documentation

## 2021-02-27 IMAGING — CT CT CARDIAC CORONARY ARTERY CALCIUM SCORE
2 of 6 series · 4 of 20 positions shown, 5 images · non-contrast
Comparison: None.
COMPARISON: None.

Addendum:
EXAM:
OVER-READ INTERPRETATION  CT CHEST

The following report is an over-read performed by radiologist Dr.
JEAN BAPTISTE [REDACTED] on [DATE]. This over-read
does not include interpretation of cardiac or coronary anatomy or
pathology. The coronary calcium score interpretation by the
cardiologist is attached.
CLINICAL DATA: Cardiovascular Disease Risk stratification
Coronary Calcium Score
TECHNIQUE: A gated, non-contrast computed tomography scan of the heart was
performed using 3mm slice thickness. Axial images were analyzed on a
dedicated workstation. Calcium scoring of the coronary arteries was
performed using the Agatston method.

[Series 7: 2 soft full fov · axial · 0.74mm/px · z∈[-162,-123]mm · 2 of 39 slices shown, 3 images]
[im 13/39  vessel]
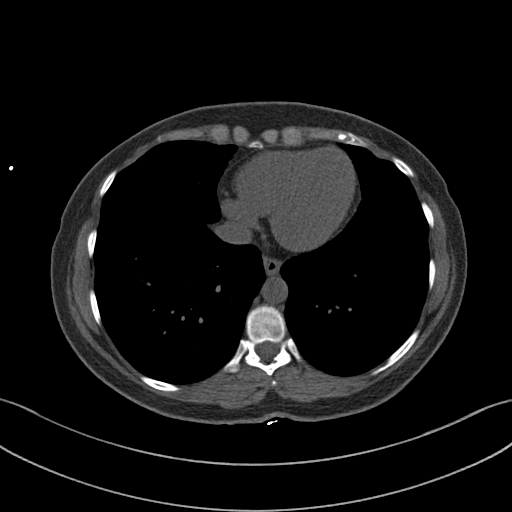
[im 13/39  lung]
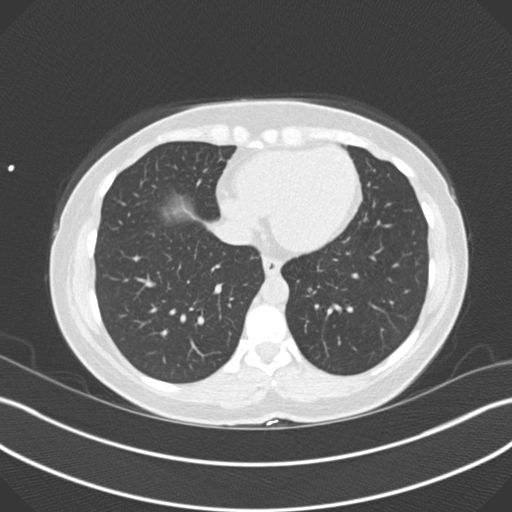
[im 26/39  vessel]
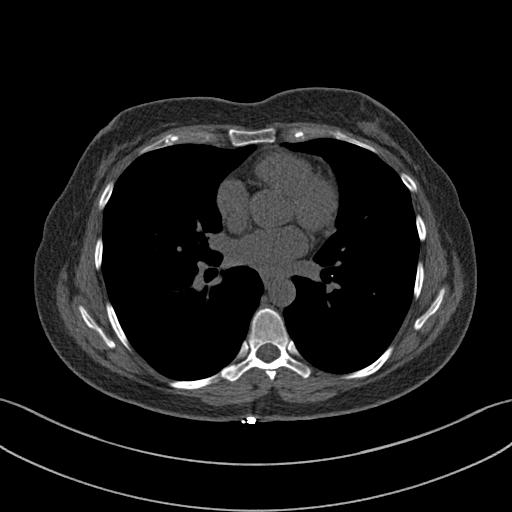

[Series 9: lungs · axial · 0.74mm/px · z∈[-162,-123]mm · 2 of 39 slices shown]
[im 13/39  vessel]
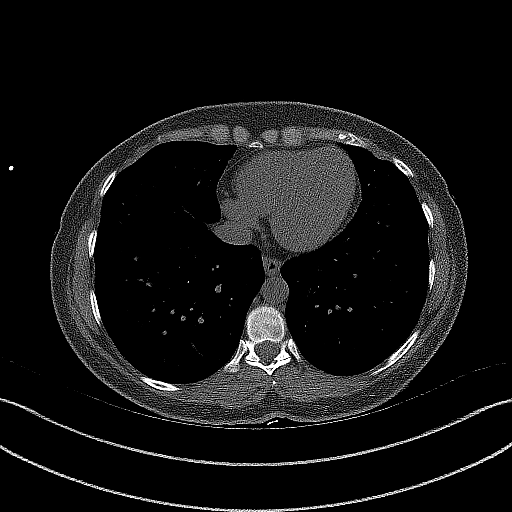
[im 26/39  vessel]
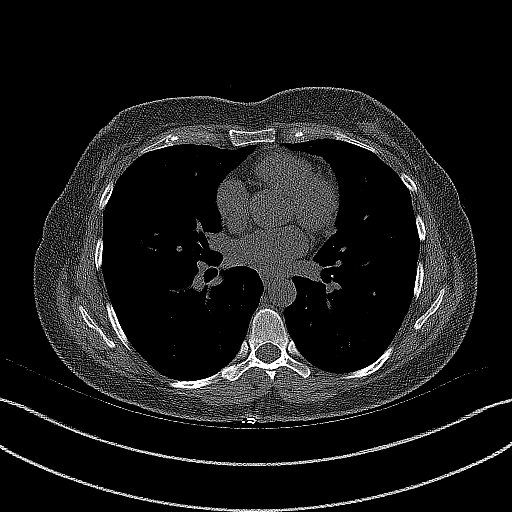

[4 of 20 positions shown; findings below may reference images not displayed]

FINDINGS: Vascular: Normal caliber of the visualized thoracic aorta.

Mediastinum/Nodes: Visualized mediastinal structures are normal.

Lungs/Pleura: 3 mm nodule along the right major fissure on image 8.
Elongated density along the left major fissure on sequence 6 image 9
measures 7 x 3 mm, mean 5 mm. No large areas of airspace disease or
consolidation in the visualized lungs. No large pleural effusions.

Upper Abdomen: Images of the upper abdomen are unremarkable.

Musculoskeletal: No acute bone abnormality.
IMPRESSION: 1. No acute extracardiac findings.
2. Small nodular densities along the fissures. These could be
related to fissural lymph nodes but indeterminate. Largest nodular
area has a mean diameter of 5 mm. No follow-up needed if patient is
low-risk (and has no known or suspected primary neoplasm).
Non-contrast chest CT can be considered in 12 months if patient is
high-risk. This recommendation follows the consensus statement:
Guidelines for Management of Incidental Pulmonary Nodules Detected
FINDINGS: Respiratory/motion artifact noted.

Coronary arteries: Normal origins.

Coronary Calcium Score:

Left main: 0

Left anterior descending artery: 0

Left circumflex artery: 0

Right coronary artery: 0

Total: 0

Percentile: 0

Pericardium: Normal.

Ascending Aorta: Normal caliber.

Non-cardiac: See separate report from [REDACTED].
IMPRESSION: Coronary calcium score of 0, however, significant respiratory/motion
artifact was noted.



If CAC=0, it is reasonable to withhold statin therapy and reassess
in 5 to 10 years, as long as higher risk conditions are absent
(diabetes mellitus, family history of premature CHD in first degree
relatives (males <55 years; females <65 years), cigarette smoking,
or LDL >=190 mg/dL).

If CAC is 1 to 99, it is reasonable to initiate statin therapy for
patients >=55 years of age.

If CAC is >=100 or >=75th percentile, it is reasonable to initiate
statin therapy at any age.

Cardiology referral should be considered for patients with CAC
scores >=400 or >=75th percentile.

*[T2] AHA/ACC/AACVPR/AAPA/ABC/JEAN BAPTISTE/JEAN BAPTISTE/JEAN BAPTISTE/JEAN BAPTISTE/JEAN BAPTISTE/JEAN BAPTISTE/JEAN BAPTISTE
Guideline on the Management of Blood Cholesterol: A Report of the
American College of Cardiology/American Heart Association Task Force
on Clinical Practice Guidelines. J Am Coll Cardiol.
[T2];73(24):[PHONE_NUMBER].

*** End of Addendum ***
EXAM:
OVER-READ INTERPRETATION  CT CHEST

The following report is an over-read performed by radiologist Dr.
JEAN BAPTISTE [REDACTED] on [DATE]. This over-read
does not include interpretation of cardiac or coronary anatomy or
pathology. The coronary calcium score interpretation by the
cardiologist is attached.
FINDINGS: Vascular: Normal caliber of the visualized thoracic aorta.

Mediastinum/Nodes: Visualized mediastinal structures are normal.

Lungs/Pleura: 3 mm nodule along the right major fissure on image 8.
Elongated density along the left major fissure on sequence 6 image 9
measures 7 x 3 mm, mean 5 mm. No large areas of airspace disease or
consolidation in the visualized lungs. No large pleural effusions.

Upper Abdomen: Images of the upper abdomen are unremarkable.

Musculoskeletal: No acute bone abnormality.
IMPRESSION: 1. No acute extracardiac findings.
2. Small nodular densities along the fissures. These could be
related to fissural lymph nodes but indeterminate. Largest nodular
area has a mean diameter of 5 mm. No follow-up needed if patient is
low-risk (and has no known or suspected primary neoplasm).
Non-contrast chest CT can be considered in 12 months if patient is
high-risk. This recommendation follows the consensus statement:
Guidelines for Management of Incidental Pulmonary Nodules Detected

## 2021-03-22 ENCOUNTER — Other Ambulatory Visit: Payer: Self-pay | Admitting: Family Medicine

## 2021-03-22 DIAGNOSIS — M25511 Pain in right shoulder: Secondary | ICD-10-CM

## 2021-03-22 DIAGNOSIS — S46811A Strain of other muscles, fascia and tendons at shoulder and upper arm level, right arm, initial encounter: Secondary | ICD-10-CM

## 2021-04-27 ENCOUNTER — Other Ambulatory Visit: Payer: Self-pay | Admitting: Family Medicine

## 2021-04-27 DIAGNOSIS — M25511 Pain in right shoulder: Secondary | ICD-10-CM

## 2021-04-27 DIAGNOSIS — S46811A Strain of other muscles, fascia and tendons at shoulder and upper arm level, right arm, initial encounter: Secondary | ICD-10-CM

## 2021-05-26 ENCOUNTER — Emergency Department (HOSPITAL_COMMUNITY): Payer: BC Managed Care – PPO

## 2021-05-26 ENCOUNTER — Other Ambulatory Visit: Payer: Self-pay

## 2021-05-26 ENCOUNTER — Emergency Department (HOSPITAL_COMMUNITY)
Admission: EM | Admit: 2021-05-26 | Discharge: 2021-05-26 | Disposition: A | Discharge status: Home / Self Care | Payer: BC Managed Care – PPO | Attending: Emergency Medicine | Admitting: Emergency Medicine

## 2021-05-26 ENCOUNTER — Encounter (HOSPITAL_COMMUNITY): Payer: Self-pay | Admitting: Emergency Medicine

## 2021-05-26 DIAGNOSIS — R42 Dizziness and giddiness: Secondary | ICD-10-CM | POA: Diagnosis not present

## 2021-05-26 DIAGNOSIS — R200 Anesthesia of skin: Secondary | ICD-10-CM

## 2021-05-26 DIAGNOSIS — R0789 Other chest pain: Secondary | ICD-10-CM | POA: Diagnosis not present

## 2021-05-26 DIAGNOSIS — R208 Other disturbances of skin sensation: Secondary | ICD-10-CM | POA: Insufficient documentation

## 2021-05-26 DIAGNOSIS — R519 Headache, unspecified: Secondary | ICD-10-CM | POA: Insufficient documentation

## 2021-05-26 LAB — COMPREHENSIVE METABOLIC PANEL
ALT: 19 U/L (ref 0–44)
AST: 20 U/L (ref 15–41)
Albumin: 4.1 g/dL (ref 3.5–5.0)
Alkaline Phosphatase: 61 U/L (ref 38–126)
Anion gap: 11 (ref 5–15)
BUN: 13 mg/dL (ref 6–20)
CO2: 23 mmol/L (ref 22–32)
Calcium: 9.2 mg/dL (ref 8.9–10.3)
Chloride: 103 mmol/L (ref 98–111)
Creatinine, Ser: 0.91 mg/dL (ref 0.44–1.00)
GFR, Estimated: >60 mL/min (ref >60)
Glucose, Bld: 134 mg/dL — ABNORMAL HIGH (ref 70–99)
Potassium: 4 mmol/L (ref 3.5–5.1)
Sodium: 137 mmol/L (ref 135–145)
Total Bilirubin: 0.9 mg/dL (ref 0.3–1.2)
Total Protein: 6.6 g/dL (ref 6.5–8.1)

## 2021-05-26 LAB — I-STAT CHEM 8, ED
BUN: 13 mg/dL (ref 6–20)
Calcium, Ion: 1.18 mmol/L (ref 1.15–1.40)
Chloride: 103 mmol/L (ref 98–111)
Creatinine, Ser: 0.8 mg/dL (ref 0.44–1.00)
Glucose, Bld: 128 mg/dL — ABNORMAL HIGH (ref 70–99)
HCT: 42 % (ref 36.0–46.0)
Hemoglobin: 14.3 g/dL (ref 12.0–15.0)
Potassium: 4 mmol/L (ref 3.5–5.1)
Sodium: 138 mmol/L (ref 135–145)
TCO2: 24 mmol/L (ref 22–32)

## 2021-05-26 LAB — DIFFERENTIAL
Abs Immature Granulocytes: 0.01 10*3/uL (ref 0.00–0.07)
Basophils Absolute: 0 10*3/uL (ref 0.0–0.1)
Basophils Relative: 0 %
Eosinophils Absolute: 0.2 10*3/uL (ref 0.0–0.5)
Eosinophils Relative: 3 %
Immature Granulocytes: 0 %
Lymphocytes Relative: 38 %
Lymphs Abs: 2.1 10*3/uL (ref 0.7–4.0)
Monocytes Absolute: 0.4 10*3/uL (ref 0.1–1.0)
Monocytes Relative: 7 %
Neutro Abs: 2.9 10*3/uL (ref 1.7–7.7)
Neutrophils Relative %: 52 %

## 2021-05-26 LAB — CBC
HCT: 40.8 % (ref 36.0–46.0)
Hemoglobin: 13.3 g/dL (ref 12.0–15.0)
MCH: 26 pg (ref 26.0–34.0)
MCHC: 32.6 g/dL (ref 30.0–36.0)
MCV: 79.8 fL — ABNORMAL LOW (ref 80.0–100.0)
Platelets: 325 10*3/uL (ref 150–400)
RBC: 5.11 MIL/uL (ref 3.87–5.11)
RDW: 12.8 % (ref 11.5–15.5)
WBC: 5.6 10*3/uL (ref 4.0–10.5)
nRBC: 0 % (ref 0.0–0.2)

## 2021-05-26 LAB — PROTIME-INR
INR: 0.9 (ref 0.8–1.2)
Prothrombin Time: 12.1 seconds (ref 11.4–15.2)

## 2021-05-26 LAB — I-STAT BETA HCG BLOOD, ED (MC, WL, AP ONLY): I-stat hCG, quantitative: <5 m[IU]/mL (ref <5)

## 2021-05-26 LAB — APTT: aPTT: 31 seconds (ref 24–36)

## 2021-05-26 LAB — TROPONIN I (HIGH SENSITIVITY)
Troponin I (High Sensitivity): 5 ng/L (ref <18)
Troponin I (High Sensitivity): 3 ng/L (ref <18)

## 2021-05-26 LAB — CBG MONITORING, ED: Glucose-Capillary: 121 mg/dL — ABNORMAL HIGH (ref 70–99)

## 2021-05-26 IMAGING — MR MR HEAD W/O CM
12 of 13 series · 44 of 48 positions shown · non-contrast
Comparison: No pertinent prior exam.

CLINICAL DATA: Neuro deficit, acute, stroke suspected

EXAM:
MRI HEAD WITHOUT CONTRAST
MRA HEAD WITHOUT CONTRAST
TECHNIQUE: Multiplanar, multi-echo pulse sequences of the brain and surrounding
structures were acquired without intravenous contrast. Angiographic
images of the Circle of Willis were acquired using MRA technique
without intravenous contrast.

[Series 5: DWI · axial · 3.0mm · 0.88mm/px · z∈[-163,-32]mm · 8 of 102 slices shown (1 of 4)]
[im 1/102]
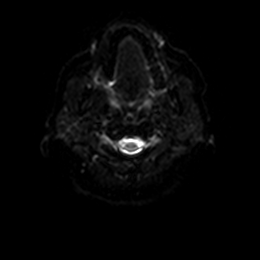
[im 15/102]
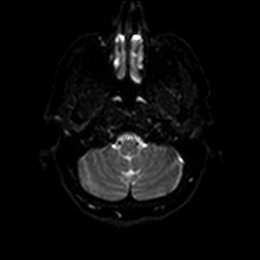
[im 29/102]
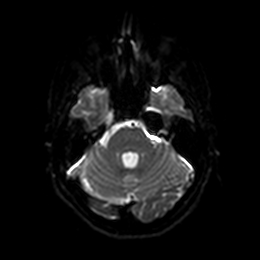
[im 44/102]
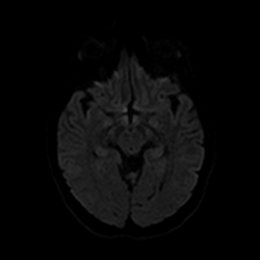
[im 58/102]
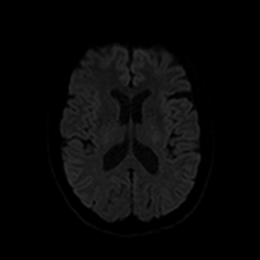
[im 73/102]
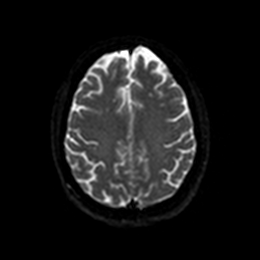
[im 87/102]
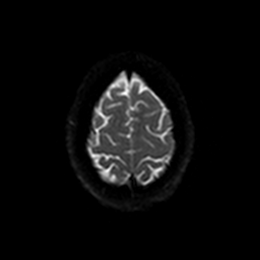
[im 102/102]
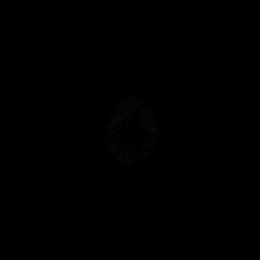

[Series 6: DWI · axial · 3.0mm · 0.88mm/px · z∈[-163,-32]mm · 4 of 51 slices shown (2 of 4)]
[im 1/51]
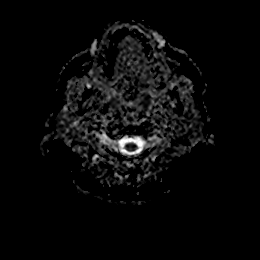
[im 17/51]
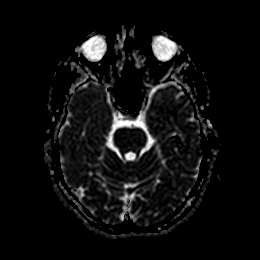
[im 34/51]
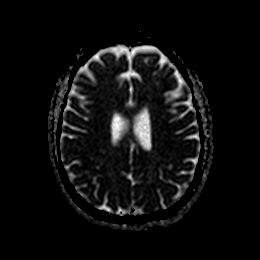
[im 51/51]
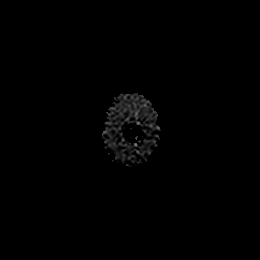

[Series 7: DWI · coronal · 4.0mm · 0.88mm/px · 6 of 72 slices shown (3 of 4)]
[im 1/72]
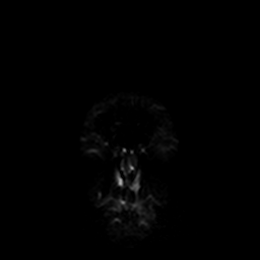
[im 15/72]
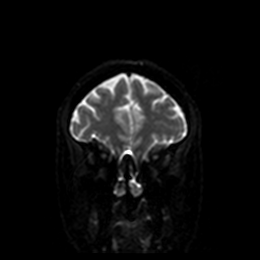
[im 29/72]
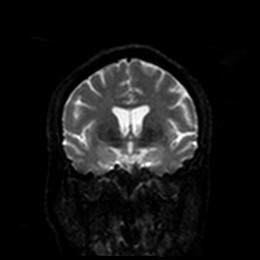
[im 43/72]
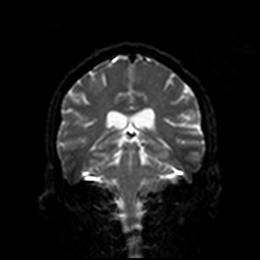
[im 57/72]
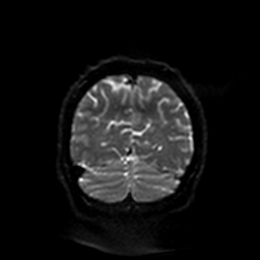
[im 72/72]
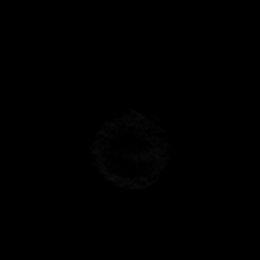

[Series 8: DWI · coronal · 4.0mm · 0.88mm/px · 3 of 36 slices shown (4 of 4)]
[im 1/36]
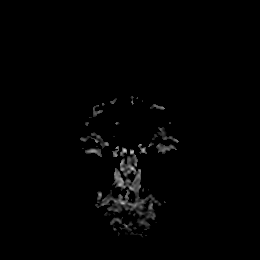
[im 18/36]
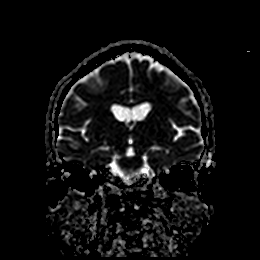
[im 36/36]
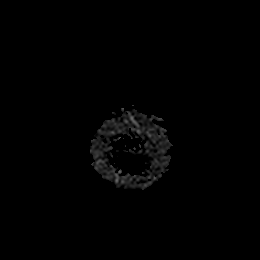

[Series 9: T1 · sagittal · 5.0mm · 0.75mm/px · 2 of 24 slices shown]
[im 1/24]
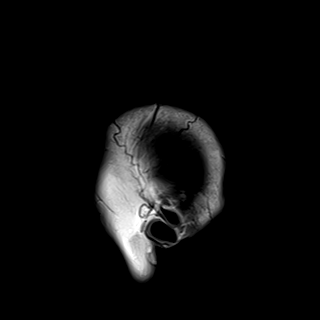
[im 24/24]
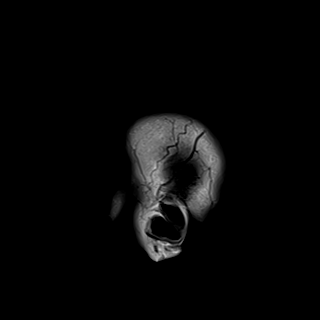

[Series 10: T2 · axial · 5.0mm · 0.72mm/px · z∈[-171,-30]mm · 2 of 28 slices shown (1 of 2)]
[im 1/28]
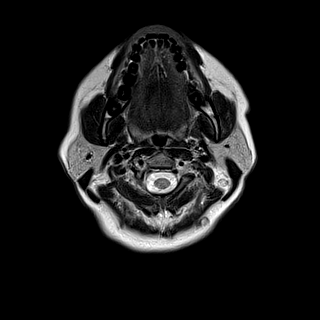
[im 28/28]
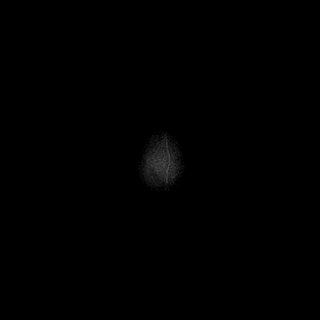

[Series 11: FLAIR · axial · 5.0mm · 0.45mm/px · z∈[-170,-29]mm · 2 of 28 slices shown]
[im 1/28]
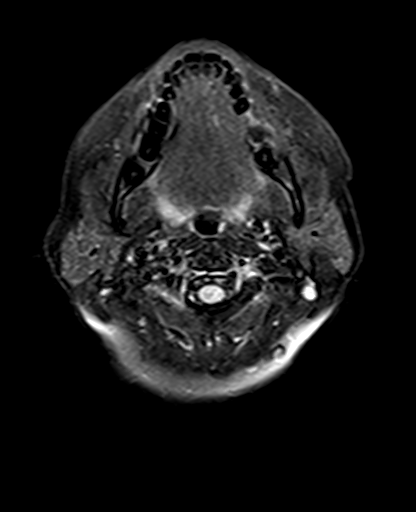
[im 28/28]
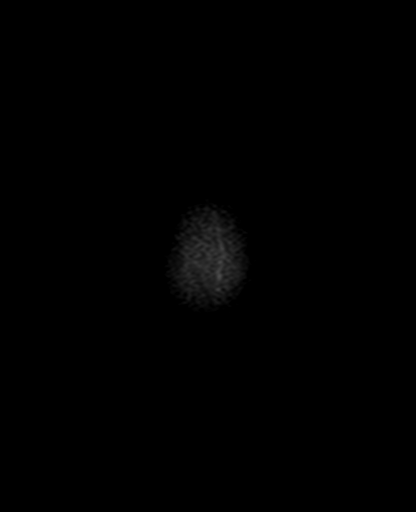

[Series 12: mag_images · axial · 3.0mm · 0.90mm/px · z∈[-162,-29]mm · 4 of 52 slices shown]
[im 1/52]
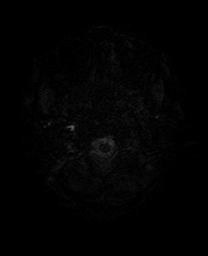
[im 18/52]
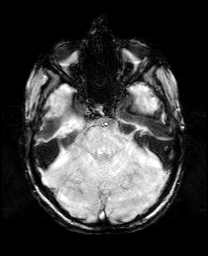
[im 35/52]
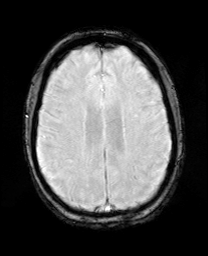
[im 52/52]
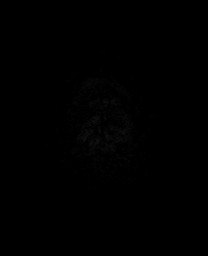

[Series 13: pha_images · axial · 3.0mm · 0.90mm/px · z∈[-162,-35]mm · 4 of 50 slices shown]
[im 1/50]
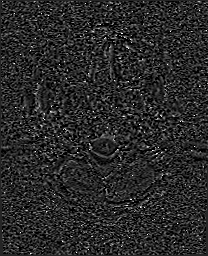
[im 17/50]
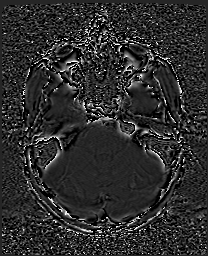
[im 33/50]
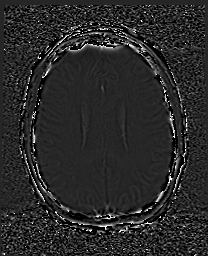
[im 50/50]
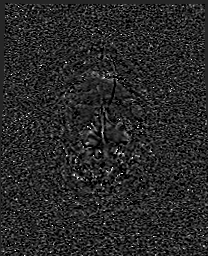

[Series 14: swi_images · axial · 3.0mm · 0.90mm/px · z∈[-162,-29]mm · 4 of 52 slices shown]
[im 1/52]
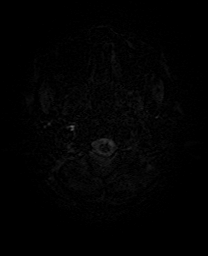
[im 18/52]
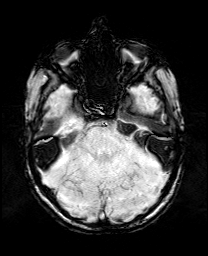
[im 35/52]
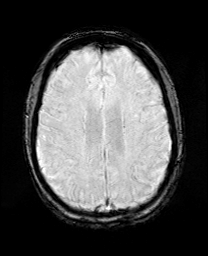
[im 52/52]
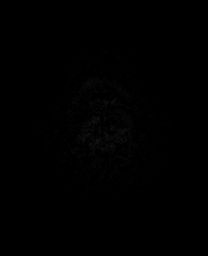

[Series 15: mip_images(sw) · axial · 24.0mm · 0.90mm/px · z∈[-153,-38]mm · 3 of 45 slices shown]
[im 1/45]
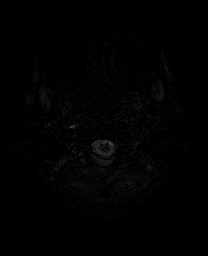
[im 23/45]
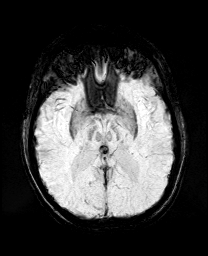
[im 45/45]
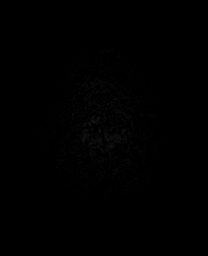

[Series 17: T2 · oblique · 5.0mm · 0.34mm/px · 2 of 29 slices shown (2 of 2)]
[im 1/29]
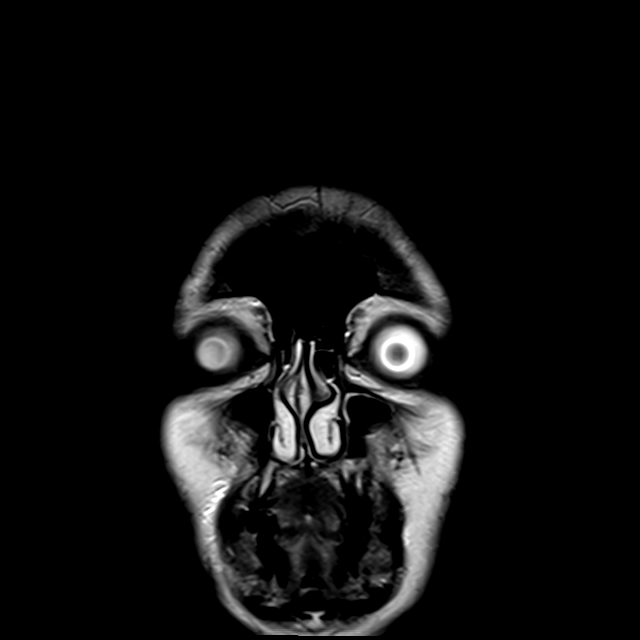
[im 29/29]
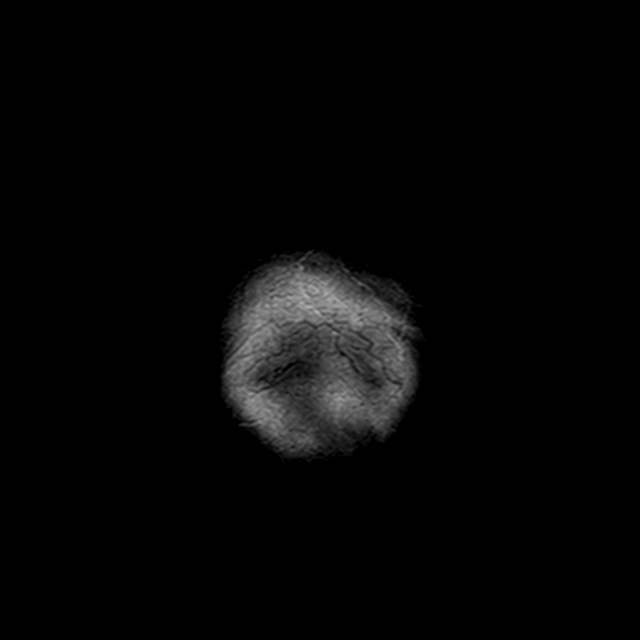

[44 of 48 positions shown; findings below may reference images not displayed]

FINDINGS: MRI HEAD

Brain: There is no acute infarction or intracranial hemorrhage.
There is no intracranial mass, mass effect, or edema. There is no
hydrocephalus or extra-axial fluid collection. Ventricles and sulci
are within normal limits in size and configuration. Few scattered
small foci of T2 hyperintensity in the supratentorial white matter
are nonspecific and could reflect minor chronic microvascular
ischemic changes or other gliosis/demyelination.

Vascular: Major vessel flow voids at the skull base are preserved.

Skull and upper cervical spine: Normal marrow signal is preserved.

Sinuses/Orbits: Paranasal sinuses are aerated. Orbits are
unremarkable.

Other: Sella is unremarkable.  Mastoid air cells are clear.

MRA HEAD

Intracranial internal carotid arteries are patent. Middle and
anterior cerebral arteries are patent. Intracranial vertebral
arteries, basilar artery, posterior cerebral arteries are patent. A
left posterior communicating artery is present. There is no
significant stenosis or aneurysm.
IMPRESSION: No acute infarction, hemorrhage, or mass.

Unremarkable MRA of the head.

## 2021-05-26 IMAGING — MR MR MRA HEAD W/O CM
5 series · 39 of 48 positions shown · non-contrast
Comparison: No pertinent prior exam.

CLINICAL DATA: Neuro deficit, acute, stroke suspected

EXAM:
MRI HEAD WITHOUT CONTRAST
MRA HEAD WITHOUT CONTRAST
TECHNIQUE: Multiplanar, multi-echo pulse sequences of the brain and surrounding
structures were acquired without intravenous contrast. Angiographic
images of the Circle of Willis were acquired using MRA technique
without intravenous contrast.

[Series 1: aahead_scout · sagittal · 1.6mm · 1.62mm/px · 15 of 128 slices shown]
[im 1/128]
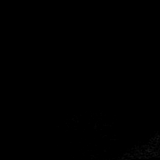
[im 10/128]
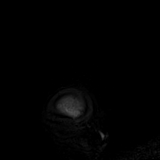
[im 19/128]
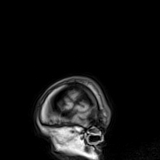
[im 28/128]
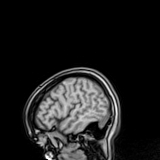
[im 37/128]
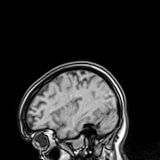
[im 46/128]
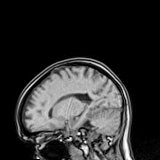
[im 55/128]
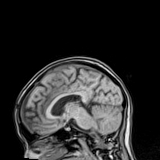
[im 64/128]
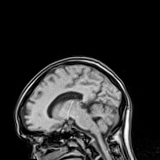
[im 73/128]
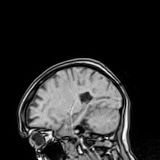
[im 82/128]
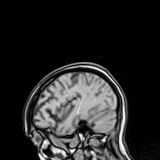
[im 91/128]
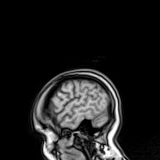
[im 100/128]
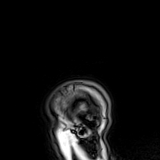
[im 109/128]
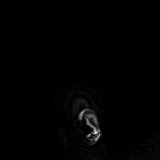
[im 118/128]
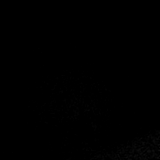
[im 128/128]
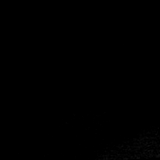

[Series 2: aahead_scout_mpr_sag · sagittal · 1.6mm · 1.60mm/px · 3 of 21 slices shown]
[im 1/21]
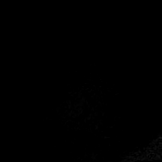
[im 11/21]
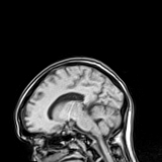
[im 21/21]
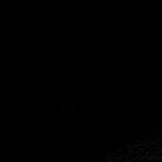

[Series 3: aahead_scout_mpr_cor · coronal · 1.6mm · 1.60mm/px · 3 of 23 slices shown]
[im 1/23]
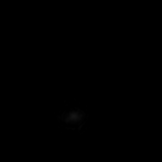
[im 12/23]
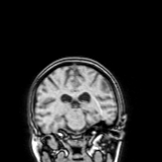
[im 23/23]
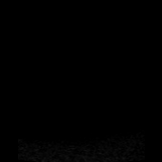

[Series 4: aahead_scout_mpr_tra · axial · 1.6mm · 1.60mm/px · z∈[-110,+130]mm · 2 of 20 slices shown]
[im 1/20]
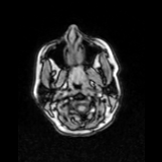
[im 20/20]
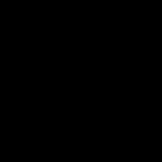

[Series 5: 3d cow · axial · 0.5mm · 0.41mm/px · z∈[-131,-38]mm · 16 of 208 slices shown]
[im 1/208]
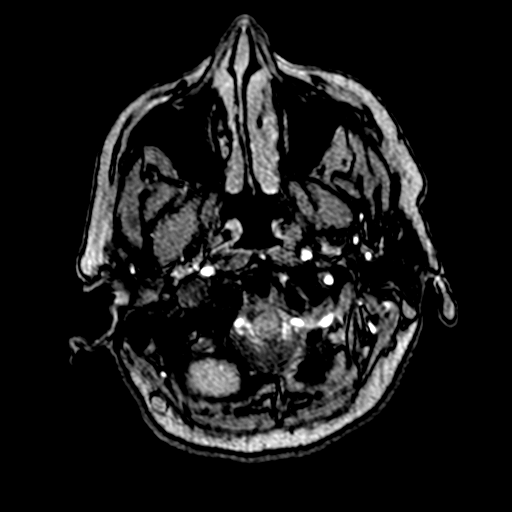
[im 9/208]
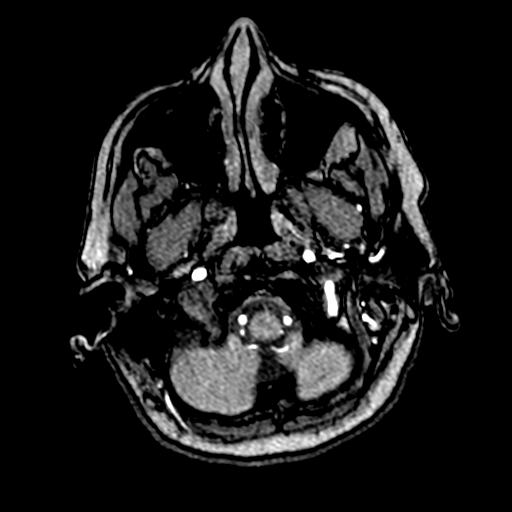
[im 18/208]
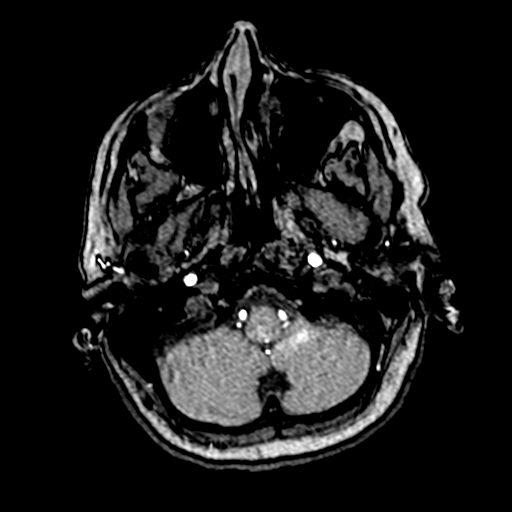
[im 26/208]
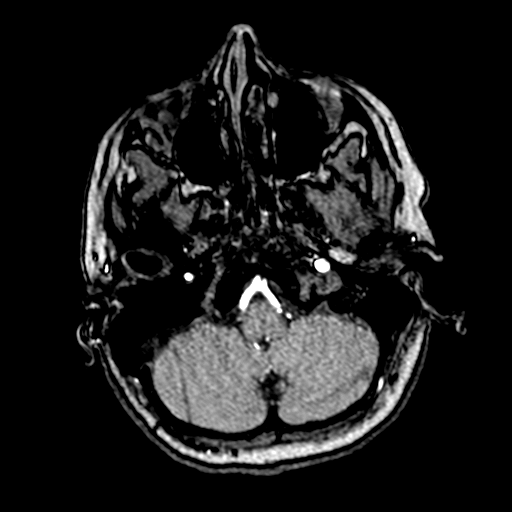
[im 35/208]
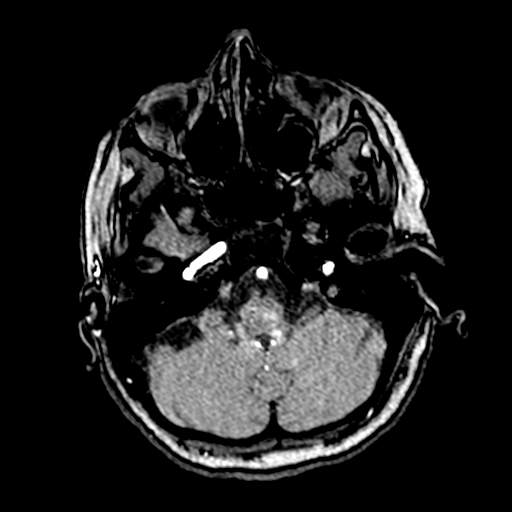
[im 44/208]
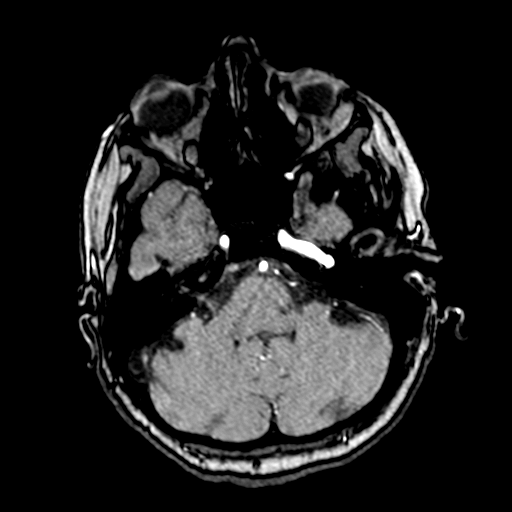
[im 52/208]
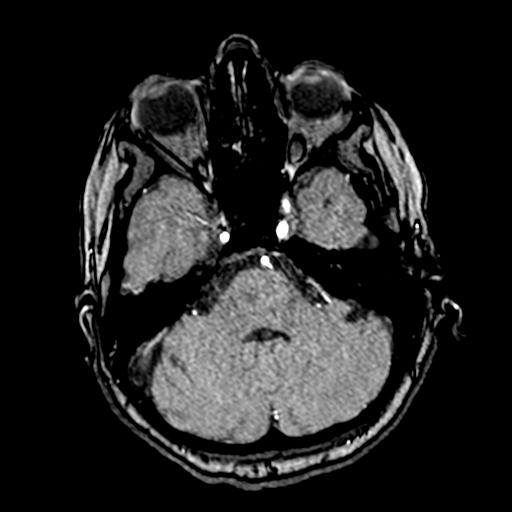
[im 61/208]
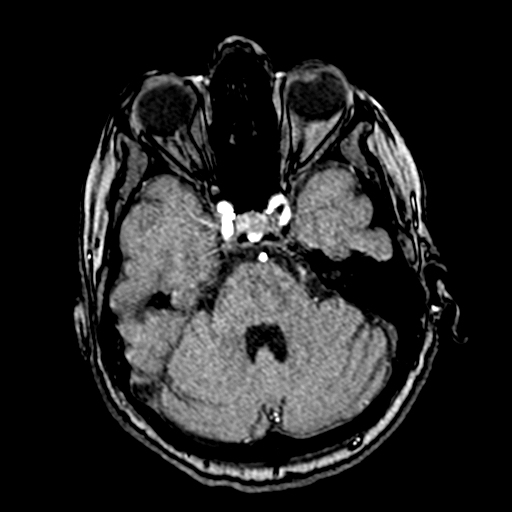
[im 70/208]
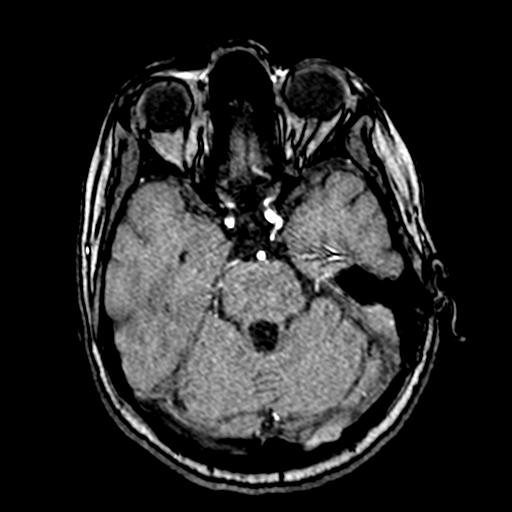
[im 78/208]
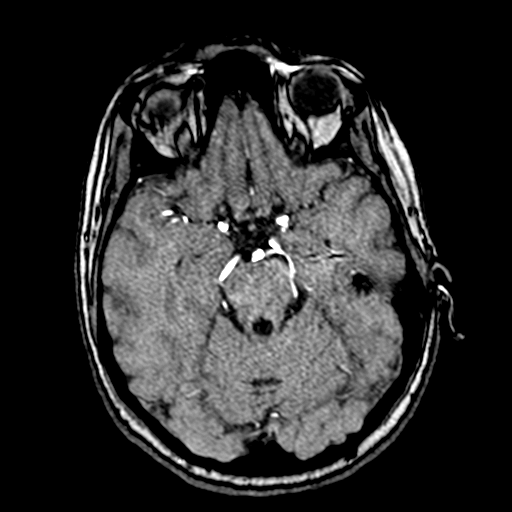
[im 87/208]
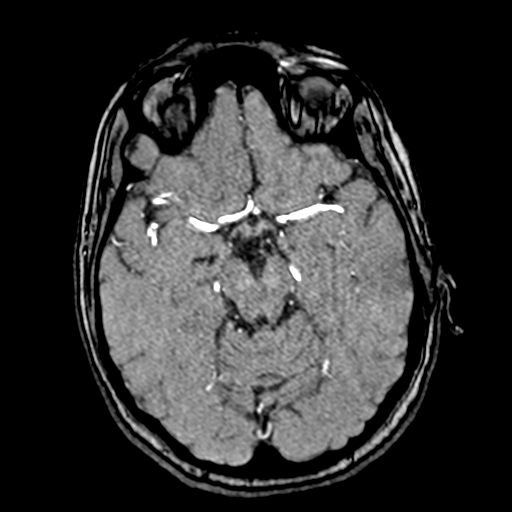
[im 95/208]
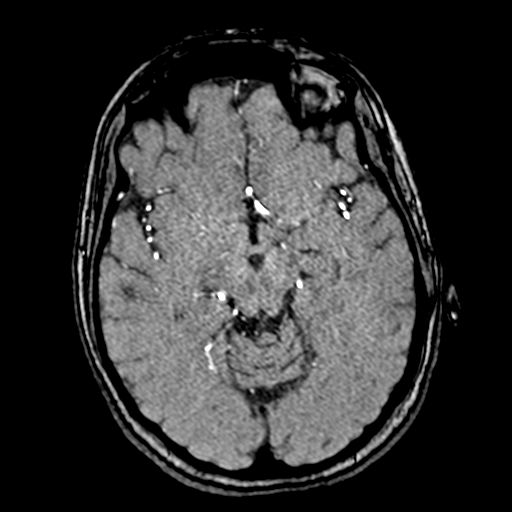
[im 121/208]
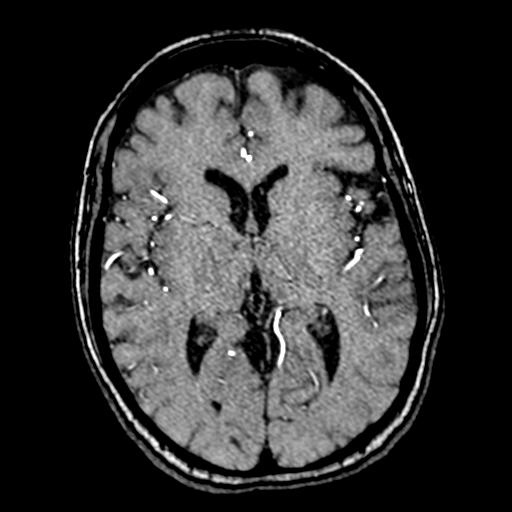
[im 147/208]
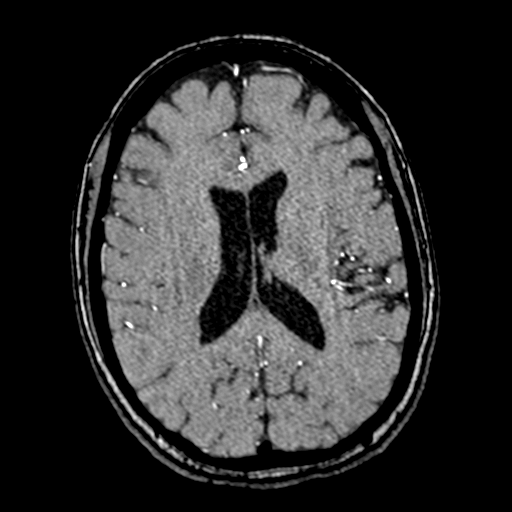
[im 173/208]
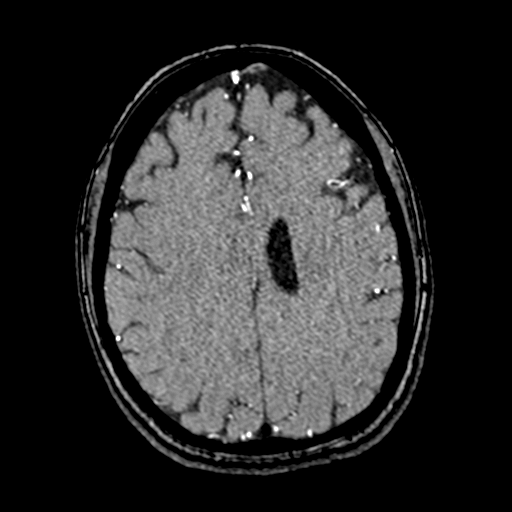
[im 199/208]
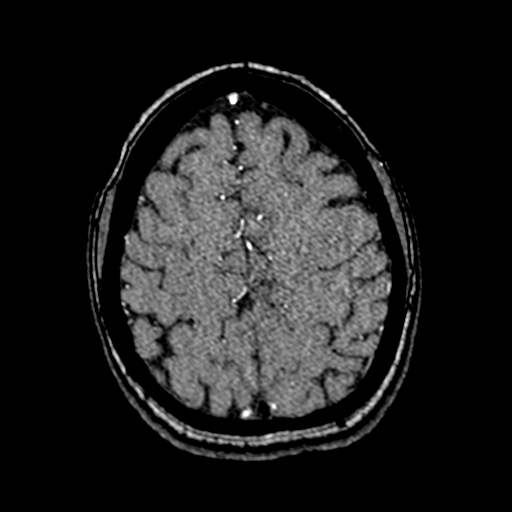

[39 of 48 positions shown; findings below may reference images not displayed]

FINDINGS: MRI HEAD

Brain: There is no acute infarction or intracranial hemorrhage.
There is no intracranial mass, mass effect, or edema. There is no
hydrocephalus or extra-axial fluid collection. Ventricles and sulci
are within normal limits in size and configuration. Few scattered
small foci of T2 hyperintensity in the supratentorial white matter
are nonspecific and could reflect minor chronic microvascular
ischemic changes or other gliosis/demyelination.

Vascular: Major vessel flow voids at the skull base are preserved.

Skull and upper cervical spine: Normal marrow signal is preserved.

Sinuses/Orbits: Paranasal sinuses are aerated. Orbits are
unremarkable.

Other: Sella is unremarkable.  Mastoid air cells are clear.

MRA HEAD

Intracranial internal carotid arteries are patent. Middle and
anterior cerebral arteries are patent. Intracranial vertebral
arteries, basilar artery, posterior cerebral arteries are patent. A
left posterior communicating artery is present. There is no
significant stenosis or aneurysm.
IMPRESSION: No acute infarction, hemorrhage, or mass.

Unremarkable MRA of the head.

## 2021-05-26 MED ORDER — SODIUM CHLORIDE 0.9% FLUSH: 3.0000 mL | Freq: Once | INTRAVENOUS | Status: DC

## 2021-05-26 MED ORDER — KETOROLAC TROMETHAMINE 30 MG/ML IJ SOLN
30.0000 mg | Freq: Once | INTRAMUSCULAR | Status: DC
  Filled 2021-05-26: qty 1

## 2021-05-26 NOTE — Discharge Instructions (Signed)
Follow-up with your primary care within 48 to 72 hours for further work-up/evaluation.  Return to the ED if new or worsening symptoms including but not limited to disorganized speech, loss of consciousness, facial asymmetry, chest pain, shortness of breath.

## 2021-05-26 NOTE — ED Triage Notes (Signed)
Pt reports numbness to R side of face, L arm, and L leg  with generalized chest pain x 30 min.  LKW 12:50pm.  No weakness.  No arm drift.  No difficulty ambulating.  PA saw pt at triage on arrival.  VAN negative.

## 2021-05-26 NOTE — ED Provider Notes (Signed)
This patient is a well-appearing 51 year old female, history of bipolar disorder, she has a history of palpitations but is seen by cardiology, no pathologic cardiovascular history.  She states that she was at a gas station at approximately 12:50 PM when she developed acute onset of numbness of the right side of her face, she was going in to get something to eat stating that she had only had some coffee this morning.  On arrival the patient also complained of left thigh numbness however she states that is been going on for about a year.  During this episode she also had some chest discomfort going into her left arm, she is gradually improving, the numbness in her face on my exam is still present mostly in the forehead not in the lower face.  Otherwise cranial nerves III through XII are normal, finger-nose-finger is normal, her peripheral visual fields are normal, she has normal strength in all 4 extremities.  She has normal cardiac and pulmonary exam, she does not have any signs of sepsis, she has no findings on her vital signs of any concern, EKG CT scan and labs will be ordered.  Discussed with neurology Dr. Otelia Limes at 4:00 PM, he recommends an MRI and an MRA of the brain without contrast to make sure there is been no stroke otherwise anticipate discharge.   Eber Hong, MD 05/26/21 2213

## 2021-05-26 NOTE — ED Provider Notes (Signed)
[facility] Provider Note   CSN: [id number] Arrival date & time: 05/26/21  1309     History  No chief complaint on file.   Beautiful Linda Underwood is a well-appearing 51 y.o. female with history of bipolar type II presenting today due to new onset of right-sided facial numbness of the forehead area that began 12:50 PM today.  She has only had a cup of coffee today and was at the gas station to get something to eat when this began.  Patient also has had left leg numbness but states this has been occurring on and off over the last year.  Event accompanied by mild chest discomfort that radiated to her left arm but has since began improving.  He also developed a headache on the right side of her forehead started 1 hour ago in the waiting room.  She states she is never had this happen before.  She has a history of palpitations and has been seen by cardiology.   No previous history of stroke or TIA.  The history is provided by the patient and medical records.      Home Medications Prior to Admission medications   Medication Sig Start Date End Date Taking? Authorizing Provider  Ascorbic Acid (VITAMIN C) 500 MG CAPS Take by mouth daily.    [provider]  cholecalciferol (VITAMIN D3) 25 MCG (1000 UNIT) tablet Take 1,000 Units by mouth daily.    [provider]  clonazePAM (KLONOPIN) 1 MG tablet Take 1 mg by mouth daily.    [provider]  cyclobenzaprine (FLEXERIL) 10 MG tablet Take 0.5-1 tablets (5-10 mg total) by mouth 3 (three) times daily as needed for muscle spasms. 10/15/20   Sharlene DoryWendling, Nicholas Paul, DO  lamoTRIgine (LAMICTAL) 150 MG tablet Take 150 mg by mouth daily.    [provider]  meloxicam (MOBIC) 15 MG tablet TAKE ONE TABLET BY MOUTH DAILY 04/29/21   Sharlene DoryWendling, Nicholas Paul, DO  Multiple Vitamin (MULTIVITAMIN) tablet Take 1 tablet by mouth daily.    [provider]  vitamin B-12 (CYANOCOBALAMIN) 500 MCG tablet  Take 500 mcg by mouth daily.    [provider]  Zinc 50 MG CAPS Take by mouth daily.    [provider]      Allergies    Patient has no known allergies.    Review of Systems   Review of Systems  Constitutional:  Negative for activity change, chills, diaphoresis, fatigue and fever.  HENT:  Negative for congestion and sore throat.   Eyes:        Negative for eye dryness  Respiratory:  Negative for chest tightness and shortness of breath.   Cardiovascular:  Negative for chest pain and palpitations.  Gastrointestinal:  Negative for nausea and vomiting.  Musculoskeletal:  Positive for gait problem.  Neurological:  Positive for light-headedness. Negative for dizziness, syncope and facial asymmetry.  Psychiatric/Behavioral:  Positive for dysphoric mood. Negative for confusion.    Physical Exam Updated Vital Signs BP 111/82    Pulse 75    Temp 98.6 F (37 C) (Oral)    Resp 14    SpO2 100%  Physical Exam Vitals and nursing note reviewed.  Constitutional:      General: She is not in acute distress.    Appearance: She is well-developed.  HENT:     Head: Normocephalic and atraumatic.  Eyes:     General: No visual field deficit.    Conjunctiva/sclera: Conjunctivae normal.  Cardiovascular:     Rate and Rhythm: Normal rate and regular rhythm.     Pulses:          Radial pulses are 2+ on the right side and 2+ on the left side.       Posterior tibial pulses are 1+ on the right side and 1+ on the left side.     Heart sounds: Normal heart sounds. No murmur heard. Pulmonary:     Effort: Pulmonary effort is normal. No respiratory distress.     Breath sounds: Normal breath sounds.  Abdominal:     Palpations: Abdomen is soft.     Tenderness: There is no abdominal tenderness.  Musculoskeletal:        General: No swelling.     Cervical back: Neck supple.     Right lower leg: No edema.     Left lower leg: No edema.  Skin:    General: Skin is warm and dry.     Capillary  Refill: Capillary refill takes less than 2 seconds.  Neurological:     Mental Status: She is alert and oriented to person, place, and time.     GCS: GCS eye subscore is 4. GCS verbal subscore is 5. GCS motor subscore is 6.     Cranial Nerves: No dysarthria or facial asymmetry.     Sensory: Sensory deficit present.     Motor: Motor function is intact. No weakness, tremor, abnormal muscle tone, seizure activity or pronator drift.     Coordination: Coordination is intact. Coordination normal. Finger-Nose-Finger Test and Heel to [facility] Test normal.     Comments: Sensory diminished on right forehead area of V1  Psychiatric:        Mood and Affect: Mood normal.    ED Results / Procedures / Treatments   Labs (all labs ordered are listed, but only abnormal results are displayed) Labs Reviewed  CBC - Abnormal; Notable for the following components:      Result Value   MCV 79.8 (*)    All other components within normal limits  COMPREHENSIVE METABOLIC PANEL - Abnormal; Notable for the following components:   Glucose, Bld 134 (*)    All other components within normal limits  CBG MONITORING, ED - Abnormal; Notable for the following components:   Glucose-Capillary 121 (*)    All other components within normal limits  I-STAT CHEM 8, ED - Abnormal; Notable for the following components:   Glucose, Bld 128 (*)    All other components within normal limits  PROTIME-INR  APTT  DIFFERENTIAL  CBG MONITORING, ED  I-STAT BETA HCG BLOOD, ED (MC, WL, AP ONLY)  TROPONIN I (HIGH SENSITIVITY)  TROPONIN I (HIGH SENSITIVITY)    EKG EKG Interpretation  Date/Time:  Sunday May 26 2021 13:24:46 EST Ventricular Rate:  80 PR Interval:  168 QRS Duration: 68 QT Interval:  332 QTC Calculation: 382 R Axis:   13 Text Interpretation: Normal sinus rhythm Normal ECG When compared with ECG of 03-Apr-2005 00:29, PREVIOUS ECG IS PRESENT Confirmed by Eber Hong ([id number]) on 05/26/2021 3:15:44 PM  Radiology CT  HEAD WO CONTRAST  Result Date: 05/26/2021 CLINICAL DATA:  Suspected stroke. Numbness of the right side of the face. EXAM: CT HEAD WITHOUT CONTRAST TECHNIQUE: Contiguous axial images were obtained from the base of the skull through the vertex without intravenous contrast. RADIATION DOSE REDUCTION: This exam was performed according to the departmental dose-optimization program which includes automated exposure control, adjustment of the mA and/or kV  according to patient size and/or use of iterative reconstruction technique. COMPARISON:  None. FINDINGS: Brain: No evidence of acute infarction, hemorrhage, hydrocephalus, extra-axial collection or mass lesion/mass effect. Vascular: No hyperdense vessel or unexpected calcification. Skull: Normal. Negative for fracture or focal lesion. Sinuses/Orbits: No acute finding. Other: None. IMPRESSION: No acute intracranial abnormality. Electronically Signed   By: Ted Mcalpine M.D.   On: 05/26/2021 15:40    Procedures Procedures    Medications Ordered in ED Medications  sodium chloride flush (NS) 0.9 % injection 3 mL (has no administration in time range)    ED Course/ Medical Decision Making/ A&P                           Medical Decision Making Well-appearing 52 year old female being assessed for sudden onset of right-sided facial numbness.  DDx includes stroke, Bell's palsy, migraine, trigeminal neuralgia.  Personally interpreted and viewed all labs, imaging, tests.  Suspicious for stroke due to sudden onset.  Patient smiling, laughing, cooperative.  Recent and remote memory intact.  Cranial nerves grossly intact with the exception of mild decrease sensation of V1 on the right side.  No recent illness and no facial asymmetry, so unlikely Bell's Palsy.  Left leg numbness been intermittent over the last year so I do not think it is involved with today's presentation.  Patient describes gradual vision changes but does not show any visual deficits on  physical exam.  Patient not experiencing photophobia or sensitivity to sound though she does have a history of migraines.  ECG negative for ST changes, arrhythmias, signs of ischemia.  I do not believe she is experiencing acute coronary syndrome or cardiac pathology.  She denies anhidrosis, burning sensation of the face, or sinus pressure I do not believe this is trigeminal neuralgia.  Finger-nose-finger normal, 5/5 strength in her extremities.  Arm drift, dysphagia/dysarthria, and facial asymmetry negative.  CT negative for any acute cranial pathology.  Considered stroke or hemorrhage as diagnosis.  Dr. Hyacinth Meeker consulted with Neuro who recommended MRI and MRA of the brain.  Awaiting results.  If these are also negative I believe it is unlikely the patient is experiencing stroke.  MRI and MRA both negative for acute infarct, hemorrhage, or mass.  Her vitals, history, presentation do not suggest sepsis or cardiac event.  Patient is stable feel comfortable discharging her at this time.  Her husband has now joined her.  Patient well-appearing, smiling and laughing with stable vitals.  Discussed findings and treatment course with patient and her husband.  They are agreeable and have no further questions.  Amount and/or Complexity of Data Reviewed Labs: ordered. Decision-making details documented in ED Course. Radiology: ordered and independent interpretation performed. Decision-making details documented in ED Course. ECG/medicine tests: ordered and independent interpretation performed. Decision-making details documented in ED Course.         Final Clinical Impression(s) / ED Diagnoses Final diagnoses:  None    Rx / DC Orders ED Discharge Orders     None         Sandrea Hammond 05/26/21 1930    Eber Hong, MD 05/26/21 2212

## 2021-05-26 NOTE — ED Provider Triage Note (Signed)
Emergency Medicine Provider Triage Evaluation Note  Linda Underwood , a 51 y.o. female  was evaluated in triage.  Pt complains of numbness to the right side of the face and left arm and left leg.  This started just prior to arrival.  Also complaining of diffuse chest pain.  Does not have any weakness.  Having trouble walking, or trouble talking.  No headaches.  No shortness of breath.  Review of Systems  Positive:  Negative: See above   Physical Exam  There were no vitals taken for this visit. Gen:   Awake, no distress   Resp:  Normal effort  MSK:   Moves extremities without difficulty  Other:  Cranial nerves II to XII are intact.  There is subjective decrease sensation to the left leg and left arm.  5/5 strength to the upper and lower extremities.  Normal sensation to the upper and lower extremities.  No dysmetria on finger-nose.  Normal rapid altering movements.  Normal heel-to-shin.  No pronator drift.  Normal gait.  Medical Decision Making  Medically screening exam initiated at 1:17 PM.  Appropriate orders placed.  [name] was informed that the remainder of the evaluation will be completed by another provider, this initial triage assessment does not replace that evaluation, and the importance of remaining in the ED until their evaluation is complete.     Myna Bright [place], [place] 05/26/21 1319

## 2021-06-03 ENCOUNTER — Other Ambulatory Visit: Payer: Self-pay | Admitting: Family Medicine

## 2021-06-03 DIAGNOSIS — M25511 Pain in right shoulder: Secondary | ICD-10-CM

## 2021-06-03 DIAGNOSIS — S46811A Strain of other muscles, fascia and tendons at shoulder and upper arm level, right arm, initial encounter: Secondary | ICD-10-CM

## 2021-06-06 ENCOUNTER — Encounter: Payer: Self-pay | Admitting: Family Medicine

## 2021-06-06 ENCOUNTER — Other Ambulatory Visit: Payer: Self-pay | Admitting: Family Medicine

## 2021-06-06 MED ORDER — ESTROGENS CONJUGATED 0.625 MG/GM VA CREA: 1.0000 | TOPICAL_CREAM | Freq: Every day | VAGINAL | 0 refills | Status: DC

## 2021-07-04 ENCOUNTER — Other Ambulatory Visit: Payer: Self-pay | Admitting: Family Medicine

## 2021-07-04 ENCOUNTER — Encounter: Payer: Self-pay | Admitting: Family Medicine

## 2021-07-04 DIAGNOSIS — Z0000 Encounter for general adult medical examination without abnormal findings: Secondary | ICD-10-CM

## 2021-11-18 ENCOUNTER — Encounter: Payer: BC Managed Care – PPO | Admitting: Plastic Surgery

## 2021-12-13 ENCOUNTER — Encounter: Payer: Self-pay | Admitting: Plastic Surgery

## 2021-12-13 ENCOUNTER — Ambulatory Visit (INDEPENDENT_AMBULATORY_CARE_PROVIDER_SITE_OTHER): Payer: Self-pay | Admitting: Plastic Surgery

## 2021-12-13 DIAGNOSIS — Z719 Counseling, unspecified: Secondary | ICD-10-CM

## 2021-12-13 NOTE — Progress Notes (Signed)

## 2022-02-05 ENCOUNTER — Encounter: Payer: Self-pay | Admitting: Family Medicine

## 2022-02-05 ENCOUNTER — Ambulatory Visit (INDEPENDENT_AMBULATORY_CARE_PROVIDER_SITE_OTHER): Payer: BC Managed Care – PPO | Admitting: Family Medicine

## 2022-02-05 VITALS — BP 102/64 | HR 74 | Temp 98.8°F | Ht 61.0 in | Wt 129.0 lb

## 2022-02-05 DIAGNOSIS — Z1211 Encounter for screening for malignant neoplasm of colon: Secondary | ICD-10-CM

## 2022-02-05 DIAGNOSIS — Z0000 Encounter for general adult medical examination without abnormal findings: Secondary | ICD-10-CM

## 2022-02-05 DIAGNOSIS — Z1231 Encounter for screening mammogram for malignant neoplasm of breast: Secondary | ICD-10-CM

## 2022-02-05 DIAGNOSIS — R7303 Prediabetes: Secondary | ICD-10-CM | POA: Diagnosis not present

## 2022-02-05 LAB — CBC
HCT: 38.9 % (ref 36.0–46.0)
Hemoglobin: 13 g/dL (ref 12.0–15.0)
MCHC: 33.4 g/dL (ref 30.0–36.0)
MCV: 78.7 fl (ref 78.0–100.0)
Platelets: 291 10*3/uL (ref 150.0–400.0)
RBC: 4.94 Mil/uL (ref 3.87–5.11)
RDW: 13.4 % (ref 11.5–15.5)
WBC: 4.6 10*3/uL (ref 4.0–10.5)

## 2022-02-05 LAB — COMPREHENSIVE METABOLIC PANEL
ALT: 20 U/L (ref 0–35)
AST: 13 U/L (ref 0–37)
Albumin: 4.2 g/dL (ref 3.5–5.2)
Alkaline Phosphatase: 58 U/L (ref 39–117)
BUN: 16 mg/dL (ref 6–23)
CO2: 29 mEq/L (ref 19–32)
Calcium: 9.2 mg/dL (ref 8.4–10.5)
Chloride: 105 mEq/L (ref 96–112)
Creatinine, Ser: 0.84 mg/dL (ref 0.40–1.20)
GFR: 80.27 mL/min (ref >60.00)
Glucose, Bld: 91 mg/dL (ref 70–99)
Potassium: 4.4 mEq/L (ref 3.5–5.1)
Sodium: 140 mEq/L (ref 135–145)
Total Bilirubin: 0.8 mg/dL (ref 0.2–1.2)
Total Protein: 6.5 g/dL (ref 6.0–8.3)

## 2022-02-05 LAB — LIPID PANEL
Cholesterol: 215 mg/dL — ABNORMAL HIGH (ref 0–200)
HDL: 64.9 mg/dL (ref >39.00)
LDL Cholesterol: 134 mg/dL — ABNORMAL HIGH (ref 0–99)
NonHDL: 150.32
Total CHOL/HDL Ratio: 3
Triglycerides: 84 mg/dL (ref 0.0–149.0)
VLDL: 16.8 mg/dL (ref 0.0–40.0)

## 2022-02-05 LAB — HEMOGLOBIN A1C: Hgb A1c MFr Bld: 5.8 % (ref 4.6–6.5)

## 2022-02-05 NOTE — Progress Notes (Signed)
Chief Complaint  Patient presents with   Annual Exam     Well Woman Linda Underwood is here for a complete physical.   Her last physical was >1 year ago.  Current diet: in general, diet could be better. Current exercise: walking, active [place] farm. Weight is stable and she denies fatigue out of ordinary. Seatbelt? Yes Advanced directive? 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     cholecalciferol (VITAMIN D3) 25 MCG (1000 UNIT) tablet Take 1,000 Units by mouth daily.     clonazePAM (KLONOPIN) 1 MG tablet Take 1 mg by mouth daily.     conjugated estrogens (PREMARIN) vaginal cream Place 1 Applicatorful vaginally daily. 42.5 g 0   lamoTRIgine (LAMICTAL) 100 MG tablet Take 1 tablet by mouth daily.     Multiple Vitamin (MULTIVITAMIN) tablet Take 1 tablet by mouth daily.     vitamin B-12 (CYANOCOBALAMIN) 500 MCG tablet Take 500 mcg by mouth daily.     Zinc 50 MG CAPS Take 1 capsule by mouth daily.      Allergies No Known Allergies  Review of Systems: Constitutional:  no unexpected weight changes Eye:  no recent significant change in vision Ear/Nose/Mouth/Throat:  Ears:  no recent change in hearing Nose/Mouth/Throat:  no complaints of nasal congestion,  no sore throat Cardiovascular: no chest pain Respiratory:  no shortness of breath Gastrointestinal:  no abdominal pain, no change in bowel habits GU:  Female: negative for dysuria or pelvic pain Musculoskeletal/Extremities:  no pain of the joints Integumentary (Skin/Breast):  no abnormal skin lesions reported Neurologic:  no headaches Endocrine:  denies fatigue  Exam BP 102/64 (BP Location: Right Arm, Patient Position: Sitting, Cuff Size: Normal)   Pulse 74   Temp 98.8 F (37.1 C) (Oral)   Ht 5\' 1"  (1.549 m)   Wt 129 lb (58.5 kg)   SpO2 99%   BMI 24.37 kg/m  General:  well developed, well nourished, in no apparent distress Skin:  no significant moles, warts, or growths Head:  no masses, lesions, or tenderness Eyes:  pupils equal and round, sclera anicteric without injection Ears:  canals without lesions, TMs shiny without retraction, no obvious effusion, no erythema Nose:  nares patent, mucosa normal, and no drainage  Throat/Pharynx:  lips and gingiva without lesion; tongue and uvula midline; non-inflamed pharynx; no exudates or postnasal drainage Neck: neck supple without adenopathy, thyromegaly, or masses Lungs:  clear to auscultation, breath sounds equal bilaterally, no respiratory distress Cardio:  regular rate and rhythm, no LE edema Abdomen:  abdomen soft, nontender; bowel sounds normal; no masses or organomegaly Genital: Defer to GYN Musculoskeletal:  symmetrical muscle groups noted without atrophy or deformity Extremities:  no clubbing, cyanosis, or edema, no deformities, no skin discoloration Neuro:  gait  normal; deep tendon reflexes normal and symmetric Psych: well oriented with normal range of affect and appropriate judgment/insight  Assessment and Plan  Well adult exam - Plan: CBC, Comprehensive metabolic panel, Lipid panel  Prediabetes - Plan: Hemoglobin A1c  Screening for colon cancer - Plan: Ambulatory referral to Gastroenterology  Encounter for screening  mammogram for malignant neoplasm of breast - Plan: MM DIGITAL SCREENING BILATERAL   Well 51 y.o. female. Counseled on diet and exercise. Advanced directive form provided today.  Flu shot, Shingrix rec'd. Declines all shots.  Will refer to GI for CCS. Ordering a mammogram again.  Other orders as above. Follow up in 1 yr or prn. The patient voiced understanding and agreement to the plan.  [place], DO 02/05/22 10:54 AM

## 2022-02-05 NOTE — Patient Instructions (Addendum)
Give [place] 2-3 business days to get the results of your labs back.   Keep the diet clean and stay active.  Please get me a copy of your advanced directive form at your convenience.   If you do not hear anything about your referral in the next 1-2 weeks, call our office and ask for an update.  The Shingrix vaccine (for shingles) is a 2 shot series spaced 2-6 months apart. It can make people feel low energy, achy and almost like they have the flu for 48 hours after injection. 1/5 people can have nausea and/or vomiting. Please plan accordingly when deciding on when to get this shot. Call our office for a nurse visit appointment to get this. The second shot of the series is less severe regarding the side effects, but it still lasts 48 hours.   Let us know if you need anything.

## 2022-02-27 ENCOUNTER — Telehealth (HOSPITAL_BASED_OUTPATIENT_CLINIC_OR_DEPARTMENT_OTHER): Payer: Self-pay

## 2022-06-19 ENCOUNTER — Ambulatory Visit: Payer: BC Managed Care – PPO | Admitting: Podiatry

## 2022-06-26 ENCOUNTER — Ambulatory Visit (INDEPENDENT_AMBULATORY_CARE_PROVIDER_SITE_OTHER): Payer: BC Managed Care – PPO | Admitting: Podiatry

## 2022-06-26 ENCOUNTER — Encounter: Payer: Self-pay | Admitting: Podiatry

## 2022-06-26 ENCOUNTER — Ambulatory Visit (INDEPENDENT_AMBULATORY_CARE_PROVIDER_SITE_OTHER): Payer: BC Managed Care – PPO

## 2022-06-26 DIAGNOSIS — M79672 Pain in left foot: Secondary | ICD-10-CM | POA: Diagnosis not present

## 2022-06-26 DIAGNOSIS — M7671 Peroneal tendinitis, right leg: Secondary | ICD-10-CM

## 2022-06-26 MED ORDER — MELOXICAM 15 MG PO TABS: 15.0000 mg | ORAL_TABLET | Freq: Every day | ORAL | 0 refills | Status: DC

## 2022-06-26 NOTE — Progress Notes (Signed)
  Subjective:  Patient ID: Linda Underwood Seen, female    DOB: 1971-05-12,   MRN: [id number]  Chief Complaint  Patient presents with   Foot Pain    Bilateral foot pain , patient states this has been on going for 3 years but within the last year she states it has been getting worse    52 y.o. female presents for concern of bilateral foot pain that has been going on for 3 years and getting worse in the last year. Relates a lot of the pain is at the back of her ankle and around the top of her foot.  Relates first steps out of bed are painful and after walking for long periods of time are very painful.  She has tried some braces and was given stretching in the past but nothing helped. Has tried inserts and pads for the bottom of her foot with no relief.  Denies any other pedal complaints. Denies n/v/f/c.   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 CFT <3 seconds. Normal hair growth on digits. No edema.  Skin. No lacerations or abrasions bilateral feet.  Musculoskeletal: MMT 5/5 bilateral lower extremities in DF, PF, Inversion and Eversion. Deceased ROM in DF of ankle joint.  Tender from insertion of peroneal tendon proxiamlly and posterior to the lateral malleolus. Pain with DF inversion and eversion in the area of the peroneal tendon. No pain along achilles or medial foot.  Neurological: Sensation intact to light touch.   Assessment:   1. Peroneal tendonitis, right      Plan:  Patient was evaluated and treated and all questions answered. X-rays reviewed and discussed with patient. No acute  fractures or dislocations noted.  Reviewed previous notes from prior visits.  Discussed peroneal tendinitis and treatment options at length with patient Discussed stretching exercises. She would like to try PT. Referral to PT sent.  Prescription for meloxicam provided Dispensed Tri-Lock ankle brace. Discussed that if the symptoms do not improve can consider PT/MRI. Patient to return in 6 to 8 weeks or sooner if symptoms fail to improve or worsen.   Lorenda Peck, DPM

## 2022-08-21 ENCOUNTER — Ambulatory Visit: Payer: BC Managed Care – PPO | Admitting: Podiatry

## 2022-09-11 ENCOUNTER — Ambulatory Visit (INDEPENDENT_AMBULATORY_CARE_PROVIDER_SITE_OTHER): Payer: BC Managed Care – PPO | Admitting: Podiatry

## 2022-09-11 DIAGNOSIS — Z91199 Patient's noncompliance with other medical treatment and regimen due to unspecified reason: Secondary | ICD-10-CM

## 2022-09-11 NOTE — Progress Notes (Signed)
No show

## 2022-09-27 ENCOUNTER — Encounter: Payer: BC Managed Care – PPO | Admitting: Plastic Surgery

## 2022-11-10 ENCOUNTER — Encounter: Payer: BC Managed Care – PPO | Admitting: Plastic Surgery

## 2023-02-02 ENCOUNTER — Ambulatory Visit (INDEPENDENT_AMBULATORY_CARE_PROVIDER_SITE_OTHER): Payer: BC Managed Care – PPO | Admitting: Family Medicine

## 2023-02-02 ENCOUNTER — Encounter: Payer: Self-pay | Admitting: Family Medicine

## 2023-02-02 VITALS — BP 110/62 | HR 64 | Temp 98.4°F | Ht 60.0 in | Wt 136.0 lb

## 2023-02-02 DIAGNOSIS — R0789 Other chest pain: Secondary | ICD-10-CM | POA: Diagnosis not present

## 2023-02-02 DIAGNOSIS — M549 Dorsalgia, unspecified: Secondary | ICD-10-CM | POA: Diagnosis not present

## 2023-02-02 DIAGNOSIS — Z1211 Encounter for screening for malignant neoplasm of colon: Secondary | ICD-10-CM

## 2023-02-02 MED ORDER — MELOXICAM 15 MG PO TABS: 15.0000 mg | ORAL_TABLET | Freq: Every day | ORAL | 0 refills | Status: DC

## 2023-02-02 MED ORDER — TIZANIDINE HCL 4 MG PO TABS: 4.0000 mg | ORAL_TABLET | Freq: Four times a day (QID) | ORAL | 0 refills | Status: DC | PRN

## 2023-02-02 NOTE — Patient Instructions (Addendum)
Heat (pad or rice pillow in microwave) over affected area, 10-15 minutes twice daily.   Ice/cold pack over area for 10-15 min twice daily.  OK to take Tylenol 1000 mg (2 extra strength tabs) or 975 mg (3 regular strength tabs) every 6 hours as needed.  Take the muscle relaxer (tizanidine) 1-2 hours before planned bedtime. If it makes you drowsy, do not take during the day. You can try half a tab the following night.  If you do not hear anything about your referral in the next 1-2 weeks, call our office and ask for an update.  Let us know if you need anything.  Pectoralis Major Rehab Ask your health care provider which exercises are safe for you. Do exercises exactly as told by your health care provider and adjust them as directed. It is normal to feel mild stretching, pulling, tightness, or discomfort as you do these exercises, but you should stop right away if you feel sudden pain or your pain gets worse. Do not begin these exercises until told by your health care provider. Stretching and range of motion exercises These exercises warm up your muscles and joints and improve the movement and flexibility of your shoulder. These exercises can also help to relieve pain, numbness, and tingling. Exercise A: Pendulum  Stand near a wall or a surface that you can hold onto for balance. Bend at the waist and let your left / right arm hang straight down. Use your other arm to keep your balance. Relax your arm and shoulder muscles, and move your hips and your trunk so your left / right arm swings freely. Your arm should swing because of the motion of your body, not because you are using your arm or shoulder muscles. Keep moving so your arm swings in the following directions, as told by your health care provider: Side to side. Forward and backward. In clockwise and counterclockwise circles. Slowly return to the starting position. Repeat 2 times. Complete this exercise 3 times per week. Exercise B:  Abduction, standing Stand and hold a broomstick, a cane, or a similar object. Place your hands a little more than shoulder-width apart on the object. Your left / right hand should be palm-up, and your other hand should be palm-down. While keeping your elbow straight and your shoulder muscles relaxed, push the stick across your body toward your left / right side. Raise your left / right arm to the side of your body and then over your head until you feel a stretch in your shoulder. Stop when you reach the angle that is recommended by your health care provider. Avoid shrugging your shoulder while you raise your arm. Keep your shoulder blade tucked down toward the middle of your spine. Hold for 10 seconds. Slowly return to the starting position. Repeat 2 times. Complete this exercise 3 times per week. Exercise C: Wand flexion, supine  Lie on your back. You may bend your knees for comfort. Hold a broomstick, a cane, or a similar object so that your hands are about shoulder-width apart on the object. Your palms should face toward your feet. Raise your left / right arm in front of your face, then behind your head (toward the floor). Use your other hand to help you do this. Stop when you feel a gentle stretch in your shoulder, or when you reach the angle that is recommended by your health care provider. Hold for 3 seconds. Use the broomstick and your other arm to help you return your  left / right arm to the starting position. Repeat 2 times. Complete this exercise 3 times per week. Exercise D: Wand shoulder external rotation Stand and hold a broomstick, a cane, or a similar object so your hands are about shoulder-width apart on the object. Start with your arms hanging down, then bend both elbows to an "L" shape (90 degrees). Keep your left / right elbow at your side. Use your other hand to push the stick so your left / right forearm moves away from your body, out to your side. Keep your left / right  elbow bent to 90 degrees and keep it against your side. Stop when you feel a gentle stretch in your shoulder, or when you reach the angle recommended by your health care provider. Hold for 10 seconds. Use the stick to help you return your left / right arm to the starting position. Repeat 2 times. Complete this exercise 3 times per week. Strengthening exercises These exercises build strength and endurance in your shoulder. Endurance is the ability to use your muscles for a long time, even after your muscles get tired. Exercise E: Scapular protraction, standing Stand so you are facing a wall. Place your feet about one arm-length away from the wall. Place your hands on the wall and straighten your elbows. Keep your hands on the wall as you push your upper back away from the wall. You should feel your shoulder blades sliding forward. Keep your elbows and your head still. If you are not sure that you are doing this exercise correctly, ask your health care provider for more instructions. Hold for 3 seconds. Slowly return to the starting position. Let your muscles relax completely before you repeat this exercise. Repeat 2 times. Complete this exercise 3 times per week. Exercise F: Shoulder blade squeezes  (scapular retraction) Sit with good posture in a stable chair. Do not let your back touch the back of the chair. Your arms should be at your sides with your elbows bent. You may rest your forearms on a pillow if that is more comfortable. Squeeze your shoulder blades together. Bring them down and back. Keep your shoulders level. Do not lift your shoulders up toward your ears. Hold for 3 seconds. Return to the starting position. Repeat 2 times. Complete this exercise 3 times per week. This information is not intended to replace advice given to you by your health care provider. Make sure you discuss any questions you have with your health care provider. Document Released: 04/28/2005 Document  Revised: 02/07/2016 Document Reviewed: 01/14/2015 Elsevier Interactive Patient Education  2018 [employer]   Mid-Back Strain Rehab It is normal to feel mild stretching, pulling, tightness, or discomfort as you do these exercises, but you should stop right away if you feel sudden pain or your pain gets worse.  Stretching and range of motion exercises This exercise warms up your muscles and joints and improves the movement and flexibility of your back and shoulders. This exercise also help to relieve pain. Exercise A: Chest and spine stretch  Lie down on your back on a firm surface. Roll a towel or a small blanket so it is about 4 inches (10 cm) in diameter. Put the towel lengthwise under the middle of your back so it is under your spine, but not under your shoulder blades. To increase the stretch, you may put your hands behind your head and let your elbows fall to your sides. Hold for 30 seconds. Repeat exercise 2 times. Complete this exercise  3 times per week. Strengthening exercises These exercises build strength and endurance in your back and your shoulder blade muscles. Endurance is the ability to use your muscles for a long time, even after they get tired. Exercise C: Straight arm rows (shoulder extension) Stand with your feet shoulder width apart. Secure an exercise band to a stable object in front of you so the band is at or above shoulder height. Hold one end of the exercise band in each hand. Straighten your elbows and lift your hands up to shoulder height. Step back, away from the secured end of the exercise band, until the band stretches. Squeeze your shoulder blades together and pull your hands down to the sides of your thighs. Stop when your hands are straight down by your sides. Do not let your hands go behind your body. Hold for 2 seconds. Slowly return to the starting position. Repeat 2 times. Complete this exercise 3 times per week. Exercise D: Shoulder external  rotation, prone Lie on your abdomen on a firm bed so your left / right forearm hangs over the edge of the bed and your upper arm is on the bed, straight out from your body. Your elbow should be bent. Your palm should be facing your feet. If instructed, hold a 2-5 lb weight in your hand. Squeeze your shoulder blade toward the middle of your back. Do not let your shoulder lift toward your ear. Keep your elbow bent in an "L" shape (90 degrees) while you slowly move your forearm up toward the ceiling. Move your forearm up to the height of the bed, toward your head. Your upper arm should not move. At the top of the movement, your palm should face the floor. Hold for 1 second. Slowly return to the starting position and relax your muscles. Repeat 2 times. Complete this exercise 3 times per week. Exercise E: Scapular retraction and external rotation, rowing  Sit in a stable chair without armrests, or stand. Secure an exercise band to a stable object in front of you so it is at shoulder height. Hold one end of the exercise band in each hand. Bring your arms out straight in front of you. Step back, away from the secured end of the exercise band, until the band stretches. Pull the band backward. As you do this, bend your elbows and squeeze your shoulder blades together, but avoid letting the rest of your body move. Do not let your shoulders lift up toward your ears. Stop when your elbows are at your sides or slightly behind your body. Hold for 1 second1. Slowly straighten your arms to return to the starting position. Repeat 2 times. Complete this exercise 3 times per week. Posture and body mechanics  Body mechanics refers to the movements and positions of your body while you do your daily activities. Posture is part of body mechanics. Good posture and healthy body mechanics can help to relieve stress in your body's tissues and joints. Good posture means that your spine is in its natural S-curve  position (your spine is neutral), your shoulders are pulled back slightly, and your head is not tipped forward. The following are general guidelines for applying improved posture and body mechanics to your everyday activities. Standing  When standing, keep your spine neutral and your feet about hip-width apart. Keep a slight bend in your knees. Your ears, shoulders, and hips should line up. When you do a task in which you lean forward while standing in one place for a  long time, place one foot up on a stable object that is 2-4 inches (5-10 cm) high, such as a footstool. This helps keep your spine neutral. Sitting  When sitting, keep your spine neutral and keep your feet flat on the floor. Use a footrest, if necessary, and keep your thighs parallel to the floor. Avoid rounding your shoulders, and avoid tilting your head forward. When working at a desk or a computer, keep your desk at a height where your hands are slightly lower than your elbows. Slide your chair under your desk so you are close enough to maintain good posture. When working at a computer, place your monitor at a height where you are looking straight ahead and you do not have to tilt your head forward or downward to look at the screen. Resting  When lying down and resting, avoid positions that are most painful for you. If you have pain with activities such as sitting, bending, stooping, or squatting (flexion-based activities), lie in a position in which your body does not bend very much. For example, avoid curling up on your side with your arms and knees near your chest (fetal position). If you have pain with activities such as standing for a long time or reaching with your arms (extension-based activities), lie with your spine in a neutral position and bend your knees slightly. Try the following positions: Lying on your side with a pillow between your knees. Lying on your back with a pillow under your knees.  Lifting  When lifting  objects, keep your feet at least shoulder-width apart and tighten your abdominal muscles. Bend your knees and hips and keep your spine neutral. It is important to lift using the strength of your legs, not your back. Do not lock your knees straight out. Always ask for help to lift heavy or awkward objects. Make sure you discuss any questions you have with your health care provider. Document Released: 04/28/2005 Document Revised: 01/03/2016 Document Reviewed: 02/07/2015 Elsevier Interactive Patient Education  [employer].

## 2023-02-02 NOTE — Progress Notes (Signed)
Musculoskeletal Exam  Patient: Linda Underwood DOB: 22-Feb-1971  DOS: 02/02/2023  SUBJECTIVE:  Chief Complaint:   Chief Complaint  Patient presents with   Back Pain    Linda Underwood is a 52 y.o.  female for evaluation and treatment of chest wall/back pain.   Onset:  2 days ago. No inj or change in activity.  Location: around chest wall Character:  sharp  Progression of issue:  is unchanged Associated symptoms: certain movements flare it up No bruising, redness, swelling, pain with inspiration, coughing, fevers Treatment: to date has been OTC NSAIDS.   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 No acute distress. Heart: RRR Thorax & Lungs: CTAB. No accessory muscle use Musculoskeletal: chest wall.   Tenderness to palpation: Yes over the rhomboids bilaterally, upper thoracic spine midline and anterior chest Deformity: no Ecchymosis: no Neurologic: Normal sensory function. No focal deficits noted. DTR's equal and symmetric in UE's. No clonus. Psychiatric: Normal mood. Age appropriate judgment and insight. Alert & oriented x 3.    Assessment:  Chest wall pain - Plan: tiZANidine (ZANAFLEX) 4 MG tablet, meloxicam (MOBIC) 15 MG tablet  Mid back pain  Screen for colon cancer - Plan: Ambulatory referral to  Gastroenterology  Plan: No signs of infection.  Likely musculoskeletal in etiology.  Stretches/exercises, heat, ice, Tylenol.  Consider physical therapy if no improvement. Refer to GI for colonoscopy. F/u for physical or as needed. The patient voiced understanding and agreement to the plan.   Jilda Roche Basco, DO 02/02/23  3:13 PM

## 2023-04-01 ENCOUNTER — Encounter: Payer: Self-pay | Admitting: Sports Medicine

## 2023-04-01 ENCOUNTER — Ambulatory Visit (INDEPENDENT_AMBULATORY_CARE_PROVIDER_SITE_OTHER): Payer: BC Managed Care – PPO | Admitting: Sports Medicine

## 2023-04-01 VITALS — BP 100/68 | Ht 60.0 in | Wt 136.0 lb

## 2023-04-01 DIAGNOSIS — M7541 Impingement syndrome of right shoulder: Secondary | ICD-10-CM

## 2023-04-01 NOTE — Progress Notes (Signed)
   Subjective:    Patient ID: Linda Underwood, female    DOB: 05/27/1970, 52 y.o.   MRN: [id number]  HPI chief complaint: Right shoulder pain  Patient is a very pleasant right-hand-dominant 52 year old female that presents today with several weeks of right shoulder pain.  Pain initially began as diffuse pain across her upper back and chest as well as into the right shoulder.  Her primary care physician placed her on meloxicam which resolved the pain in her upper back and chest.  She also states that it helped with the pain in the right shoulder but did not resolve it.  Her pain is along the lateral shoulder and will radiate to the elbow.  No pain past the elbow.  She does notice increasing pain with certain shoulder motions.  She has pain at night.  She has had a total of 5 physical therapy visits but had to discontinue because of insurance issues.  She does not recall any specific trauma but does state that she recently started exercising at the gym.  Past medical history reviewed Medications reviewed Allergies reviewed  Review of Systems As above    Objective:   Physical Exam  Right shoulder: Full range of motion with a positive painful arc.  No tenderness to palpation.  No atrophy.  Positive empty can, positive Hawkins.  Rotator cuff strength is 5/5 but does reproduce pain with resisted supraspinatus and external rotation.  Neurovascularly intact distally.      Assessment & Plan:   Right shoulder pain secondary to rotator cuff impingement/rotator cuff tendinitis  I recommend that the patient resume her meloxicam 15 mg daily for 7 days and we will couple that with a home exercise program focusing on Jobe rotator cuff exercises.  After 1 week she will discontinue the meloxicam but will continue with daily home exercises for 2 additional weeks.  If symptoms do not resolve then consider merits of subacromial cortisone injection at that time.  Follow-up for ongoing or recalcitrant issues.  This  note was dictated using Dragon naturally speaking software and may contain errors in syntax, spelling, or content which have not been identified prior to signing this note.

## 2023-10-12 ENCOUNTER — Ambulatory Visit: Payer: Self-pay

## 2023-10-12 NOTE — Telephone Encounter (Signed)
 Copied from CRM [id number]. Topic: Clinical - Red Word Triage >> Oct 12, 2023  2:35 PM Deaijah H wrote: Red Word that prompted transfer to Nurse Triage: Heartbeat skipping / chest pain   Chief Complaint: Has had palpitations "for years, but now I have pain with them, lasts a few seconds."  Symptoms: above Frequency: 2 weeks Pertinent Negatives: Patient denies  Disposition: [] ED /[] Urgent Care (no appt availability in office) / [x] Appointment(In office/virtual)/ []  [facility]/ [] Home Care/ [] Refused Recommended Disposition /[] [facility]/ []  Follow-up with PCP Additional Notes: agrees with appointment. Will go to ED for worsening of symptoms.  Reason for Disposition  [1] Palpitations AND [2] no improvement after using Care Advice  Answer Assessment - Initial Assessment Questions 1. DESCRIPTION: "Please describe your heart rate or heartbeat that you are having" (e.g., fast/slow, regular/irregular, skipped or extra beats, "palpitations")     Skip beats 2. ONSET: "When did it start?" (Minutes, hours or days)      On going - 2 weeks ago 3. DURATION: "How long does it last" (e.g., seconds, minutes, hours)     Pain lasts seconds 4. PATTERN "Does it come and go, or has it been constant since it started?"  "Does it get worse with exertion?"   "Are you feeling it now?"     Comes and goes 5. TAP: "Using your hand, can you tap out what you are feeling on a chair or table in front of you, so that I can hear?" (Note: not all patients can do this)       no 6. HEART RATE: "Can you tell me your heart rate?" "How many beats in 15 seconds?"  (Note: not all patients can do this)       Skips 7. RECURRENT SYMPTOM: "Have you ever had this before?" If Yes, ask: "When was the last time?" and "What happened that time?"      yes 8. CAUSE: "What do you think is causing the palpitations?"     yes 9. CARDIAC HISTORY: "Do you have any history of heart disease?" (e.g., heart attack, angina,  bypass surgery, angioplasty, arrhythmia)      yes 10. OTHER SYMPTOMS: "Do you have any other symptoms?" (e.g., dizziness, chest pain, sweating, difficulty breathing)       Chest pain, dizzy 11. PREGNANCY: "Is there any chance you are pregnant?" "When was your last menstrual period?"       no  Protocols used: Heart Rate and Heartbeat Questions-A-AH

## 2023-10-13 ENCOUNTER — Ambulatory Visit (INDEPENDENT_AMBULATORY_CARE_PROVIDER_SITE_OTHER): Admitting: Family Medicine

## 2023-10-13 ENCOUNTER — Encounter: Payer: Self-pay | Admitting: Family Medicine

## 2023-10-13 VITALS — BP 110/64 | HR 99 | Temp 98.0°F | Resp 16 | Ht 61.0 in | Wt 152.4 lb

## 2023-10-13 DIAGNOSIS — R002 Palpitations: Secondary | ICD-10-CM | POA: Diagnosis not present

## 2023-10-13 DIAGNOSIS — R0789 Other chest pain: Secondary | ICD-10-CM

## 2023-10-13 MED ORDER — ALBUTEROL SULFATE HFA 108 (90 BASE) MCG/ACT IN AERS
1.0000 | INHALATION_SPRAY | Freq: Four times a day (QID) | RESPIRATORY_TRACT | 0 refills | Status: AC | PRN
Start: 2023-10-13 — End: ?

## 2023-10-13 NOTE — Patient Instructions (Signed)
 Give us  2-3 business days to get the results of your labs back.   If you do not hear anything about your referral in the next 1-2 weeks, call our office and ask for an update.  Try taking the inhaler as needed or 10-15 min before planned exertion.   Let us  know if you need anything.

## 2023-10-13 NOTE — Progress Notes (Signed)
 Chief Complaint  Patient presents with   Irregular Heart Beat    Irregular Heat Beat     Linda Underwood is a 53 y.o. female here for palpitations.  Length of issue: 1 month How long does it last: a second or 2 Light headedness/passing out? Lightheadedness; no passing out She gets chest pressure that is different from the pain when [place].  Chest pain/shortness of breath? Yes chest pain that is centrally located, feels like a pinch or sharp; no SOB related to this Hx of arrhythmia? Yes- palpitations, nothing pathologic Not exertional. She has been exercising without issues.   Medication changes/illicit substances? 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 If you have any questions, ask your nurse or doctor.          STOP taking these medications    meloxicam  15 MG tablet Commonly known as: MOBIC    tiZANidine  4 MG tablet Commonly known as: Zanaflex        TAKE these medications    albuterol  108 (90 Base) MCG/ACT inhaler Commonly known as: Ventolin  HFA Inhale 1-2 puffs into the lungs every 6 (six) hours as needed for wheezing or shortness of breath.   cholecalciferol 25 MCG (1000 UNIT) tablet Commonly known as: VITAMIN D3 Take 1,000 Units by mouth daily.   conjugated estrogens  vaginal cream Commonly known as: PREMARIN  Place 1 Applicatorful vaginally daily.   cyanocobalamin 500 MCG  tablet Commonly known as: VITAMIN B12 Take 500 mcg by mouth daily.   lamoTRIgine 100 MG tablet Commonly known as: LAMICTAL Take 1 tablet by mouth daily.   multivitamin tablet Take 1 tablet by mouth daily.   Vitamin C 500 MG Caps Take 1 tablet by mouth daily.   Zinc 50 MG Caps Take 1 capsule by mouth daily.        BP 110/64 (BP Location: Left Arm, Patient Position: Sitting)   Pulse 99   Temp 98 F (36.7 C) (Oral)   Resp 16   Ht 5\' 1"  (1.549 m)   Wt 152 lb 6.4 oz (69.1 kg)   LMP 05/23/2019 (Approximate)   SpO2 96%   BMI 28.80 kg/m  Gen: Awake, alert, appearing stated age Eyes: PERRLA Mouth: MMM Heart: RRR, no bruits, no LE edema Lungs: CTAB, no accessory muscle use Neuro: No cerebellar signs MSK: No muscle group atrophy or asymmetry Psych: Age appropriate judgment and insight, mood/affect WNL  Palpitations - Plan: CBC, Comprehensive metabolic panel with GFR, TSH, Magnesium, EKG 12-Lead  Chest pressure - Plan: Ambulatory referral to Cardiology, albuterol  (VENTOLIN  HFA) 108 (90 Base) MCG/ACT inhaler  Check above labs.  EKG shows NSR, normal axis, no interval abnormalities, no ST segment or T wave changes, good R wave progression.  She does not get this when she exercises inside but rather when she works outside.  Suspect possible asthma involvement but  given overall concerns, will refer to cardiology for their opinion.  Albuterol  provided today. Orders as above. The patient voiced understanding and agreement to the plan.  Linda Underwood 4:47 PM 10/13/23

## 2023-10-14 ENCOUNTER — Ambulatory Visit: Payer: Self-pay | Admitting: Family Medicine

## 2023-10-14 ENCOUNTER — Other Ambulatory Visit: Payer: Self-pay

## 2023-10-14 DIAGNOSIS — R748 Abnormal levels of other serum enzymes: Secondary | ICD-10-CM

## 2023-10-14 LAB — COMPREHENSIVE METABOLIC PANEL WITH GFR
ALT: 57 U/L — ABNORMAL HIGH (ref 0–35)
AST: 42 U/L — ABNORMAL HIGH (ref 0–37)
Albumin: 4.3 g/dL (ref 3.5–5.2)
Alkaline Phosphatase: 54 U/L (ref 39–117)
BUN: 24 mg/dL — ABNORMAL HIGH (ref 6–23)
CO2: 27 meq/L (ref 19–32)
Calcium: 8.8 mg/dL (ref 8.4–10.5)
Chloride: 101 meq/L (ref 96–112)
Creatinine, Ser: 0.77 mg/dL (ref 0.40–1.20)
GFR: 88.06 mL/min (ref >60.00)
Glucose, Bld: 112 mg/dL — ABNORMAL HIGH (ref 70–99)
Potassium: 3.6 meq/L (ref 3.5–5.1)
Sodium: 137 meq/L (ref 135–145)
Total Bilirubin: 0.6 mg/dL (ref 0.2–1.2)
Total Protein: 6.4 g/dL (ref 6.0–8.3)

## 2023-10-14 LAB — MAGNESIUM: Magnesium: 2.1 mg/dL (ref 1.5–2.5)

## 2023-10-14 LAB — CBC
HCT: 39 % (ref 36.0–46.0)
Hemoglobin: 12.7 g/dL (ref 12.0–15.0)
MCHC: 32.6 g/dL (ref 30.0–36.0)
MCV: 79 fl (ref 78.0–100.0)
Platelets: 324 10*3/uL (ref 150.0–400.0)
RBC: 4.94 Mil/uL (ref 3.87–5.11)
RDW: 14 % (ref 11.5–15.5)
WBC: 6.8 10*3/uL (ref 4.0–10.5)

## 2023-10-14 LAB — TSH: TSH: 2.07 u[IU]/mL (ref 0.35–5.50)

## 2023-10-20 ENCOUNTER — Emergency Department (HOSPITAL_BASED_OUTPATIENT_CLINIC_OR_DEPARTMENT_OTHER)
Admission: EM | Admit: 2023-10-20 | Discharge: 2023-10-20 | Disposition: A | Discharge status: Home / Self Care | Attending: Emergency Medicine | Admitting: Emergency Medicine

## 2023-10-20 ENCOUNTER — Encounter (HOSPITAL_BASED_OUTPATIENT_CLINIC_OR_DEPARTMENT_OTHER): Payer: Self-pay | Admitting: Emergency Medicine

## 2023-10-20 ENCOUNTER — Other Ambulatory Visit: Payer: Self-pay

## 2023-10-20 ENCOUNTER — Emergency Department (HOSPITAL_BASED_OUTPATIENT_CLINIC_OR_DEPARTMENT_OTHER)

## 2023-10-20 DIAGNOSIS — R002 Palpitations: Secondary | ICD-10-CM | POA: Insufficient documentation

## 2023-10-20 DIAGNOSIS — R079 Chest pain, unspecified: Secondary | ICD-10-CM | POA: Insufficient documentation

## 2023-10-20 LAB — CBC
HCT: 38.9 % (ref 36.0–46.0)
Hemoglobin: 12.8 g/dL (ref 12.0–15.0)
MCH: 26.3 pg (ref 26.0–34.0)
MCHC: 32.9 g/dL (ref 30.0–36.0)
MCV: 80 fL (ref 80.0–100.0)
Platelets: 306 10*3/uL (ref 150–400)
RBC: 4.86 MIL/uL (ref 3.87–5.11)
RDW: 13.3 % (ref 11.5–15.5)
WBC: 6.7 10*3/uL (ref 4.0–10.5)
nRBC: 0 % (ref 0.0–0.2)

## 2023-10-20 LAB — BASIC METABOLIC PANEL WITH GFR
Anion gap: 10 (ref 5–15)
BUN: 14 mg/dL (ref 6–20)
CO2: 23 mmol/L (ref 22–32)
Calcium: 9 mg/dL (ref 8.9–10.3)
Chloride: 105 mmol/L (ref 98–111)
Creatinine, Ser: 0.7 mg/dL (ref 0.44–1.00)
GFR, Estimated: >60 mL/min (ref >60)
Glucose, Bld: 98 mg/dL (ref 70–99)
Potassium: 4.3 mmol/L (ref 3.5–5.1)
Sodium: 138 mmol/L (ref 135–145)

## 2023-10-20 LAB — TROPONIN T, HIGH SENSITIVITY
Troponin T High Sensitivity: <15 ng/L (ref <19)
Troponin T High Sensitivity: <15 ng/L (ref <19)

## 2023-10-20 NOTE — ED Provider Notes (Signed)
 [facility] Provider Note   CSN: [id number] Arrival date & time: 10/20/23  1155     History  Chief Complaint  Patient presents with   Chest Pain    Linda Underwood is a 53 y.o. female.  53 year old female presenting with palpitations.  Patient has extensive history of palpitations for "many years" she has been seen by her primary care provider this issue and has never been formally diagnosed with any cause of this.  Patient was exercising today on the treadmill with her trainer when she felt "my heart was racing", she associated this with a dull pain in her chest that resolved after a few seconds, she felt dizzy during this occurrence causing her to sit down.  She denies loss of consciousness.  Currently she is asymptomatic, denies chest pain/shortness of breath/lower extremity edema/syncope.  No cardiac history or familial history of sudden cardiac death.  Patient was evaluated for this issue several years ago, found to have 1 episode of Mobitz type II arrhythmia, was not started on any medications as a result of this, history of low BP.   Chest Pain      Home Medications Prior to Admission medications   Medication Sig Start Date End Date Taking? Authorizing Provider  albuterol  (VENTOLIN  HFA) 108 (90 Base) MCG/ACT inhaler Inhale 1-2 puffs into the lungs every 6 (six) hours as needed for wheezing or shortness of breath. 10/13/23   [name], DO  Ascorbic Acid (VITAMIN C) 500 MG CAPS Take 1 tablet by mouth daily.    [provider]  cholecalciferol (VITAMIN D3) 25 MCG (1000 UNIT) tablet Take 1,000 Units by mouth daily.    [provider]  conjugated estrogens  (PREMARIN ) vaginal cream Place 1 Applicatorful vaginally daily. 06/06/21   [name], DO  lamoTRIgine (LAMICTAL) 100 MG tablet Take 1 tablet by mouth daily.    [provider]  Multiple Vitamin (MULTIVITAMIN) tablet Take 1 tablet by  mouth daily.    [provider]  vitamin B-12 (CYANOCOBALAMIN) 500 MCG tablet Take 500 mcg by mouth daily.    [provider]  Zinc 50 MG CAPS Take 1 capsule by mouth daily.    [provider]      Allergies    Patient has no known allergies.    Review of Systems   Review of Systems  Cardiovascular:  Positive for chest pain.    Physical Exam Updated Vital Signs  Vitals:   10/20/23 1206 10/20/23 1230 10/20/23 1300 10/20/23 1400  BP:  100/71 94/61 94/62   Pulse:  76 76 70  Resp:  11 19 15   Temp:  98 F (36.7 C) 98 F (36.7 C)   TempSrc:      SpO2:  100% 99% 100%  Weight: 70 kg       Physical Exam Vitals and nursing note reviewed.  Cardiovascular:     Rate and Rhythm: Normal rate and regular rhythm.     Pulses:          Radial pulses are 2+ on the right side and 2+ on the left side.     Heart sounds: Normal heart sounds.  Pulmonary:     Effort: Pulmonary effort is normal.     Breath sounds: Normal breath sounds.  Musculoskeletal:     Cervical back: Normal range of motion.     Right lower leg: No edema.     Left lower leg: No edema.  Comments: 5 out of 5 strength against resistance of bilateral upper and lower extremities  Skin:    General: Skin is warm and dry.  Neurological:     General: No focal deficit present.     Mental Status: She is alert.     Motor: No weakness.     Comments: Facial expressions are symmetric and intact without evidence of facial droop No sensory deficits Normal finger-to-nose without evidence of cerebellar dysfunction     ED Results / Procedures / Treatments   Labs (all labs ordered are listed, but only abnormal results are displayed) Labs Reviewed  BASIC METABOLIC PANEL WITH GFR  CBC  TROPONIN T, HIGH SENSITIVITY  TROPONIN T, HIGH SENSITIVITY    EKG EKG Interpretation Date/Time:  Tuesday October 20 2023 12:04:15 EDT Ventricular Rate:  74 PR Interval:  159 QRS Duration:  79 QT Interval:  355 QTC  Calculation: 394 R Axis:   -7  Text Interpretation: Sinus rhythm LVH by voltage Anterior Q waves, possibly due to LVH Baseline wander in lead(s) V6 when compared toprior, similar appearance No STEMI Confirmed by Wynell Heath ([id number]) on 10/20/2023 12:32:28 PM  Radiology DG Chest 2 View Result Date: 10/20/2023 CLINICAL DATA:  Chest pain EXAM: CHEST - 2 VIEW COMPARISON:  X-ray 01/01/2018 FINDINGS: No consolidation, pneumothorax or effusion. No edema. Normal cardiopericardial silhouette. Overlapping cardiac leads. Air-fluid level along the stomach beneath the left hemidiaphragm. IMPRESSION: No acute cardiopulmonary disease. Electronically Signed   By: Adrianna Horde M.D.   On: 10/20/2023 13:00    Procedures Procedures    Medications Ordered in ED Medications - No data to display  ED Course/ Medical Decision Making/ A&P                                 Medical Decision Making This patient presents to the ED for concern of palpitations, this involves an extensive number of treatment options, and is a complaint that carries with it a high risk of complications and morbidity.  The differential diagnosis includes ACS, afib/flutter, other arrhythmia, hypotension.   Co morbidities that complicate the patient evaluation  History of adrenal insufficiency (?) and bipolar disorder   Additional history obtained:  Additional history obtained from record review External records from outside source obtained and reviewed including PCP/cardiology notes   Lab Tests:  I Ordered, and personally interpreted labs.  The pertinent results include: CBC and CMP within normal limits.  Initial troponin within normal limits, given change in nature of symptoms today we will repeat second troponin.  Delta troponin remains unchanged.   Imaging Studies ordered:  I ordered imaging studies including CXR  I independently visualized and interpreted imaging which showed No acute cardiopulmonary disease.  I agree  with the radiologist interpretation   Cardiac Monitoring: / EKG:  The patient was maintained on a cardiac monitor.  I personally viewed and interpreted the cardiac monitored which showed an underlying rhythm of: NSR   Problem List / ED Course / Critical interventions / Medication management I have reviewed the patients home medicines and have made adjustments as needed   Social Determinants of Health:  Former tobacco use   Test / Admission - Considered:  Physical exam is unremarkable, patient was maintained on a cardiac monitor throughout the duration of her stay in the emergency department and was found to be in a normal sinus rhythm, normal S1/S2 with lungs clear to auscultation bilaterally.  No  neurologic deficits.  Labs and imaging workup are reassuring as above, including delta troponin which was unchanged.  Patient has follow-up appointment with cardiology later this month for further workup of these symptoms, as these have continued to persist for several years.  She is also scheduled to see her primary care provider next week.  Patient is borderline hypotensive while in the emergency department, however upon review of her records this seems to be her baseline.  Encouraged adequate hydration and increase salt intake to avoid hypotension.  Patient voiced understanding and is in agreement with this plan.  Strict return precautions discussed, she is appropriate for discharge at this time.    Amount and/or Complexity of Data Reviewed Labs: ordered. Radiology: ordered.           Final Clinical Impression(s) / ED Diagnoses Final diagnoses:  Palpitations    Rx / DC Orders ED Discharge Orders     None         Kendrick Pax, [place] 10/20/23 1441    Tegeler, Marine Sia, MD 10/20/23 1530

## 2023-10-20 NOTE — ED Notes (Signed)
D/c paperwork reviewed with pt, including follow up care.  No questions or concerns voiced at time of d/c. . Pt verbalized understanding, Ambulatory without assistance to ED exit, NAD.   

## 2023-10-20 NOTE — ED Triage Notes (Signed)
 Has a cardiologist . Reports was working out , near syncope , palpitation , dizziness .  Central chest dull pain .

## 2023-10-20 NOTE — Discharge Instructions (Signed)
 Keep scheduled appointment with your primary care provider and cardiologist later this month.  Turn to the emergency department if your symptoms worsen or if you develop chest pain, shortness of breath, lightheadedness/dizziness/feeling like you may pass out.

## 2023-10-26 ENCOUNTER — Other Ambulatory Visit: Payer: Self-pay | Admitting: Family Medicine

## 2023-10-26 DIAGNOSIS — R0789 Other chest pain: Secondary | ICD-10-CM

## 2023-10-28 ENCOUNTER — Other Ambulatory Visit (INDEPENDENT_AMBULATORY_CARE_PROVIDER_SITE_OTHER)

## 2023-10-28 ENCOUNTER — Ambulatory Visit: Payer: Self-pay | Admitting: Family Medicine

## 2023-10-28 DIAGNOSIS — R748 Abnormal levels of other serum enzymes: Secondary | ICD-10-CM

## 2023-10-28 LAB — HEPATIC FUNCTION PANEL
ALT: 32 U/L (ref 0–35)
AST: 21 U/L (ref 0–37)
Albumin: 4.3 g/dL (ref 3.5–5.2)
Alkaline Phosphatase: 41 U/L (ref 39–117)
Bilirubin, Direct: 0.1 mg/dL (ref 0.0–0.3)
Total Bilirubin: 0.9 mg/dL (ref 0.2–1.2)
Total Protein: 6.3 g/dL (ref 6.0–8.3)

## 2023-10-30 ENCOUNTER — Ambulatory Visit

## 2023-10-30 VITALS — BP 94/68 | HR 74 | Ht 61.0 in | Wt 151.4 lb

## 2023-10-30 DIAGNOSIS — R911 Solitary pulmonary nodule: Secondary | ICD-10-CM | POA: Diagnosis not present

## 2023-10-30 DIAGNOSIS — R0789 Other chest pain: Secondary | ICD-10-CM | POA: Insufficient documentation

## 2023-10-30 DIAGNOSIS — R002 Palpitations: Secondary | ICD-10-CM | POA: Diagnosis not present

## 2023-10-30 NOTE — Assessment & Plan Note (Signed)
 Chest pressure on exertion associated with palpitations. She is able to continue with her exertional activities.  She is concerned about underlying cardiac issues.  Will obtain a formal treadmill EKG stress test to assess her effort tolerance, and any triggered arrhythmias or ectopic beats with exercise.

## 2023-10-30 NOTE — Patient Instructions (Signed)
 Medication Instructions:  Your physician recommends that you continue on your current medications as directed. Please refer to the Current Medication list given to you today.  *If you need a refill on your cardiac medications before your next appointment, please call your pharmacy*  Lab Work: None If you have labs (blood work) drawn today and your tests are completely normal, you will receive your results only by: MyChart Message (if you have MyChart) OR A paper copy in the mail If you have any lab test that is abnormal or we need to change your treatment, we will call you to review the results.  Testing/Procedures: Treadmill Stress Test Instructions:    1. You may take all of your medications.  2. No food, drink or tobacco products 2 hours prior to your test.  3. Dress prepared to exercise. Best to wear 2 piece outfit and tennis shoes. Shoes must be closed toe.  4. Please bring all current prescription medications.  A zio monitor was ordered today. It will remain on for 14 days. You will then return monitor and event diary in provided box. It takes 1-2 weeks for report to be downloaded and returned to us . We will call you with the results. If monitor falls off or has orange flashing light, please call Zio for further instructions.   Non-Cardiac CT scanning, (CAT scanning), is a noninvasive, special x-ray that produces cross-sectional images of the body using x-rays and a computer. CT scans help physicians diagnose and treat medical conditions. For some CT exams, a contrast material is used to enhance visibility in the area of the body being studied. CT scans provide greater clarity and reveal more details than regular x-ray exams.     Follow-Up: At [facility], you and your health needs are our priority.  As part of our continuing mission to provide you with exceptional heart care, our providers are all part of one team.  This team includes your primary Cardiologist  (physician) and Advanced Practice Providers or APPs (Physician Assistants and Nurse Practitioners) who all work together to provide you with the care you need, when you need it.  Your next appointment:   3 month(s)  Provider:   Bertha Broad, MD    We recommend signing up for the patient portal called MyChart.  Sign up information is provided on this After Visit Summary.  MyChart is used to connect with patients for Virtual Visits (Telemedicine).  Patients are able to view lab/test results, encounter notes, upcoming appointments, etc.  Non-urgent messages can be sent to your provider as well.   To learn more about what you can do with MyChart, go to [email or web address].   Other Instructions None

## 2023-10-30 NOTE — Progress Notes (Signed)
 Cardiology Consultation:    Date:  10/30/2023   ID:  Linda Underwood, DOB Mar 10, 1971, MRN [id number]  PCP:  Linda Mulder, DO  Cardiologist:  Daymon Evans Esgar Barnick, MD   Referring MD: Linda Underwood*   No chief complaint on file.    ASSESSMENT AND PLAN:   Ms. 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   Problem List Items Addressed This Visit     Palpitations   Symptoms occurring at rest. No dangerous symptoms associated with syncopal or near syncopal episodes.  Has known history of rare ventricular and supraventricular ectopic beats and dropped heartbeat consistent with type I second-degree AV block in September 2021.  With increase in symptoms now we will reassess with a heart monitor for 14 days. Reviewed recent lab work with regards to thyroid  panel CBC which was unremarkable.  Reassured her that no major abnormalities were noted in the past on heart monitor and how supraventricular ectopic beats occurring at times or frequently can be a frustrating issue but not a life-threatening condition. She did not show any significant structural abnormalities on prior echocardiogram.  Advised her to continue keeping herself well-hydrated.  Given her history of dropped heartbeats, was recommended to avoid AV nodal blocking agents and  hence for now we will hold off on any beta-blockers or calcium channel blockers.      Relevant Orders   Exercise Tolerance Test   LONG TERM MONITOR (3-14 DAYS)   CT CHEST LUNG CA SCREEN LOW DOSE W/O CM   Chest pressure - Primary   Chest pressure on exertion associated with palpitations. She is able to continue with her exertional activities.  She is concerned about underlying cardiac issues.  Will obtain a formal treadmill EKG stress test to assess her effort tolerance, and any triggered arrhythmias or ectopic beats with exercise.       Relevant Orders   EKG 12-Lead (Completed)   Cardiac Stress Test: Informed Consent Details: Physician/Practitioner Attestation; Transcribe to consent form and obtain patient signature   Exercise Tolerance Test   CT CHEST LUNG CA SCREEN LOW DOSE W/O CM   Lung nodule   Lung nodule seen on prior imaging study from September 2021 calcium score. She has a history of smoking in her 39s.  Will order repeat low-dose CT chest for lung cancer screen      Return to clinic tentatively in 3 months   History of Present Illness:    Linda Underwood is a 53 y.o. female who is being seen today for the evaluation of palpitations and chest pain at the request of Linda Underwood*.  Previously followed up with Dr. Carolynne Underwood and Dr. Emmette Underwood from [facility] in September 2021.  Pleasant woman here for the visit by herself.  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  Stays physically active.  Recently has started  exercising with a trainer at the gym 2 times a week.  This includes treadmill for 5 minutes followed by strength training for 30 minutes.  She has history of palpitations which correlated with PVCs and PACs in the past, event monitor September 2021 with a single dropped beat suspected to be type I second-degree [Wenckebach] and was amended to avoid AV nodal blocking agents. Echocardiogram August 2021 noted normal  biventricular function with LVEF 55 to 60%, GLS -17% diastolic function was reported as grade 2 with normal RV size and function and no other significant valve abnormality. Calcium score study from October 2022 was 0 and noted normal aortic size and noted small lung nodules along the fissures about 5 mm in size and recommended 9-month follow-up if patient is high risk and this is been followed up by PCP. She also has history of prediabetes with hemoglobin A1c 5.8 in September 2023. Dyslipidemia with lipid panel September 2023 total.  215, HDL 64, LDL 134 and triglycerides 84. Reports history of smoking for 3 to 4 years in her 56s.  Mention couple weeks ago she was at PCPs office and was noticing symptoms of palpitations while at rest.  She wanted this to be further evaluated.  Subsequently on October 20, 2023 she was at [facility] ER for symptoms of palpitations.  Reportedly this occurred while she was exercising on a treadmill with her trainer and symptoms resolved after few seconds.  Was hemodynamically stable with relatively soft blood pressures.  High-sensitivity troponin was unremarkable less than 15 x 2, hepatic function panel was normal, CBC was unremarkable, basic metabolic panel with normal electrolytes and kidney function.  Also recent thyroid  panel meters checked 10/13/2023 by PCP were unremarkable.  She has continued with her training after this. Continue to notice symptoms of palpitations that she describes as occurring at least 2 times a week, noticeably at rest, describes as skipped beat or extra beat sensation and occasional back-to-back heartbeats lasting few seconds and having to take a deep breath to relieve. Denies any symptoms of syncope near syncope.  Denies any sustained symptoms resulting in lightheadedness or dizziness.  With treadmill for 5 minutes prior to her strength training she continues to feel increased heartbeat sensation with chest pressure-like  sensation.  She is extremely anxious and concerned about these  EKG in the clinic today shows sinus rhythm with heart rate 74/min, PR interval 162 ms, QRS duration 78 ms, QTc 410 ms.  No significant ischemic changes. In comparison to prior EKG from June 10 no significant change  Quit smoking in her 11s after smoking for 3 to 4 years. No alcohol or drug use. Drinks a cup of coffee in the morning. No other energy drinks or colas. 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   cholecalciferol (VITAMIN D3) 25 MCG (1000 UNIT) tablet Take 1,000  Units by mouth daily.   conjugated estrogens  (PREMARIN ) vaginal cream Place 1 Applicatorful vaginally daily.   lamoTRIgine (LAMICTAL) 25 MG tablet Take 25 mg by mouth daily.   Specialty Vitamins Products (CORTICARE B PO) Take 4 tablets by mouth daily.   vitamin B-12 (CYANOCOBALAMIN) 500 MCG tablet Take 500 mcg by mouth daily.   [DISCONTINUED] lamoTRIgine (LAMICTAL) 100 MG tablet Take 1 tablet by mouth daily.     Allergies:   Patient has no known allergies.   Social History   Socioeconomic History   Marital status: Married    Spouse name: Not on file   Number of children: Not on file   Years of education: Not on file   Highest education level: Not on file  Occupational History   Not on file  Tobacco Use   Smoking status:  Former    Types: Cigarettes   Smokeless tobacco: Never  Vaping Use   Vaping status: Never Used  Substance and Sexual Activity   Alcohol use: No   Drug use: No   Sexual activity: Yes    Partners: Male  Other Topics Concern   Not on file  Social History Narrative   Not on file   Social Drivers of Health   Financial Resource Strain: Not on file  Food Insecurity: Not on file  Transportation Needs: Not on file  Physical Activity: Not on file  Stress: Not on file  Social Connections: Not on file     Family History: The patient's family history includes Peripheral Artery Disease in her father and paternal grandmother. ROS:   Please see the history of present illness.    All 14 point review of systems negative except as described per history of present illness.  EKGs/Labs/Other Studies Reviewed:    The following studies were reviewed today:   EKG:  EKG Interpretation Date/Time:  Friday October 30 2023 14:39:48 EDT Ventricular Rate:  74 PR Interval:  162 QRS Duration:  78 QT Interval:  370 QTC Calculation: 410 R Axis:   38  Text Interpretation: Normal sinus rhythm Normal ECG When compared with ECG of 20-Oct-2023 12:04, PREVIOUS ECG IS PRESENT Confirmed by Bertha Broad reddy [phone]) on 10/30/2023 3:07:05 PM    Recent Labs: 10/13/2023: Magnesium 2.1; TSH 2.07 10/20/2023: BUN 14; Creatinine, Ser 0.70; Hemoglobin 12.8; Platelets 306; Potassium 4.3; Sodium 138 10/28/2023: ALT 32  Recent Lipid Panel    Component Value Date/Time   CHOL 215 (H) 02/05/2022 1103   TRIG 84.0 02/05/2022 1103   HDL 64.90 02/05/2022 1103   CHOLHDL 3 02/05/2022 1103   VLDL 16.8 02/05/2022 1103   LDLCALC 134 (H) 02/05/2022 1103   LDLCALC 140 (H) 01/06/2020 0900    Physical Exam:    VS:  BP 94/68   Pulse 74   Ht 5' 1 (1.549 m)   Wt 151 lb 6.4 oz (68.7 kg)   LMP 05/23/2019 (Approximate)   SpO2 97%   BMI 28.61 kg/m     Wt Readings from Last 3 Encounters:  10/30/23 151 lb 6.4 oz (68.7  kg)  10/20/23 154 lb 5.2 oz (70 kg)  10/13/23 152 lb 6.4 oz (69.1 kg)     GENERAL:  Well nourished, well developed in no acute distress NECK: No JVD; No carotid bruits CARDIAC: RRR, S1 and S2 present, no murmurs, no rubs, no gallops CHEST:  Clear to auscultation without rales, wheezing or rhonchi  Extremities: No pitting pedal edema. Pulses bilaterally symmetric with radial 2+ and dorsalis pedis 2+ NEUROLOGIC:  Alert and oriented x 3  Medication Adjustments/Labs and Tests Ordered: Current medicines are reviewed at length with the patient today.  Concerns regarding medicines are outlined above.  Orders Placed This Encounter  Procedures   CT CHEST LUNG CA SCREEN LOW DOSE W/O CM   Cardiac Stress Test: Informed Consent Details: Physician/Practitioner Attestation; Transcribe to consent form and obtain patient signature   Exercise Tolerance Test   LONG TERM MONITOR (3-14 DAYS)   EKG 12-Lead   No orders of the defined types were placed in this encounter.   Signed, Lura Sallies, MD, MPH, [facility]. 10/30/2023 4:12 PM    [facility]

## 2023-10-30 NOTE — Assessment & Plan Note (Signed)
 Lung nodule seen on prior imaging study from September 2021 calcium score. She has a history of smoking in her 27s.  Will order repeat low-dose CT chest for lung cancer screen

## 2023-10-30 NOTE — Assessment & Plan Note (Signed)
 Symptoms occurring at rest. No dangerous symptoms associated with syncopal or near syncopal episodes.  Has known history of rare ventricular and supraventricular ectopic beats and dropped heartbeat consistent with type I second-degree AV block in September 2021.  With increase in symptoms now we will reassess with a heart monitor for 14 days. Reviewed recent lab work with regards to thyroid  panel CBC which was unremarkable.  Reassured her that no major abnormalities were noted in the past on heart monitor and how supraventricular ectopic beats occurring at times or frequently can be a frustrating issue but not a life-threatening condition. She did not show any significant structural abnormalities on prior echocardiogram.  Advised her to continue keeping herself well-hydrated.  Given her history of dropped heartbeats, was recommended to avoid AV nodal blocking agents and hence for now we will hold off on any beta-blockers or calcium channel blockers.

## 2023-11-02 ENCOUNTER — Ambulatory Visit: Payer: Self-pay

## 2023-11-02 ENCOUNTER — Ambulatory Visit (INDEPENDENT_AMBULATORY_CARE_PROVIDER_SITE_OTHER): Admitting: Internal Medicine

## 2023-11-02 ENCOUNTER — Encounter: Payer: Self-pay | Admitting: Internal Medicine

## 2023-11-02 VITALS — BP 116/66 | HR 75 | Temp 98.0°F | Resp 16 | Ht 61.0 in | Wt 149.5 lb

## 2023-11-02 DIAGNOSIS — R509 Fever, unspecified: Secondary | ICD-10-CM | POA: Diagnosis not present

## 2023-11-02 DIAGNOSIS — R935 Abnormal findings on diagnostic imaging of other abdominal regions, including retroperitoneum: Secondary | ICD-10-CM

## 2023-11-02 DIAGNOSIS — R1030 Lower abdominal pain, unspecified: Secondary | ICD-10-CM

## 2023-11-02 DIAGNOSIS — N889 Noninflammatory disorder of cervix uteri, unspecified: Secondary | ICD-10-CM

## 2023-11-02 LAB — POCT URINE PREGNANCY: Preg Test, Ur: NEGATIVE

## 2023-11-02 NOTE — Telephone Encounter (Signed)
 Looks like is scheduled with Dr. Amon this afternoon.

## 2023-11-02 NOTE — Patient Instructions (Signed)
 Proceed with blood work and a urine test  Drink plenty of fluids  Okay to continue taking ibuprofen or Tylenol if you spike a fever  If you get worse overnight: Fever, increased stomach pain, bloody diarrhea: Go to the emergency room.  Will communicate tomorrow, if you are not better we will proceed with a scan on your abdomen.

## 2023-11-02 NOTE — Telephone Encounter (Signed)
 FYI Only or Action Required?: FYI only for provider.  Patient was last seen in primary care on 10/13/2023 by Frann Mabel Mt, DO. Called Nurse Triage reporting Fever. Symptoms began yesterday. Interventions attempted: OTC medications: ibuprofen. Symptoms are: unchanged.  Triage Disposition: See HCP Within 4 Hours (Or PCP Triage)  Patient/caregiver understands and will follow disposition?: Yes       Copied from CRM [id number]. Topic: Clinical - Red Word Triage >> Nov 02, 2023 10:25 AM Maisie C wrote: Red Word that prompted transfer to Nurse Triage: fever at 100.9 for 3 days, body aches, weakness, possible parasite infection Reason for Disposition  Severe chills (i.e., feeling extremely cold WITH shaking chills)  Answer Assessment - Initial Assessment Questions 1. TEMPERATURE: What is the most recent temperature?  How was it measured?      100.9 2. ONSET: When did the fever start?      Two days ago 3. CHILLS: Do you have chills? If yes: How bad are they?  (e.g., none, mild, moderate, severe)   - NONE: no chills   - MILD: feeling cold   - MODERATE: feeling very cold, some shivering (feels better under a thick blanket)   - SEVERE: feeling extremely cold with shaking chills (general body shaking, rigors; even under a thick blanket)      severe 4. OTHER SYMPTOMS: Do you have any other symptoms besides the fever?  (e.g., abdomen pain, cough, diarrhea, earache, headache, sore throat, urination pain)     Weakness, body aches, abd pain 5. CAUSE: If there are no symptoms, ask: What do you think is causing the fever?      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 CONTACTS: Does anyone else in the family have an infection?     denies 7. TREATMENT: What have you done so far to treat this fever? (e.g., medications)    Advil  8. IMMUNOCOMPROMISE: Do you have of the following: diabetes, HIV positive, splenectomy, cancer chemotherapy, chronic steroid  treatment, transplant patient, etc.     denies 9. PREGNANCY: Is there any chance you are pregnant? When was your last menstrual period?     na 10. TRAVEL: Have you traveled out of the country in the last month? (e.g., travel history, exposures)       denies  Protocols used: [facility]

## 2023-11-02 NOTE — Progress Notes (Unsigned)
   Subjective:    Patient ID: Linda Underwood, female    DOB: 1970/11/04, 53 y.o.   MRN: [id number]  DOS:  11/02/2023 Type of visit - description: Acute Symptoms started 10/31/2023: That afternoon she started shivering, had aches all over, my skin hurts to touch, developed a fever up to 100.9. She took ibuprofen few times and felt better. She ate but immediately developed lower abdominal cramping.  The next day, she wake up feeling okay but subsequently had again lower abdominal discomfort and some fever. She felt fatigued.   Today she feels somewhat better.  Denies any rash or recent tick bite that she can tell No headache No nausea vomiting When asked, admits that he is having mildly loose stools instead of her normal 1 BM daily she is having 2 or 3. No LUTS, no vaginal discharge.  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   Onycholysis 02/12/2018   Palpitations 01/25/2020   Postural dizziness with presyncope 01/20/2020   PVC (premature ventricular contraction) 01/20/2020    Past Surgical History:  Procedure Laterality Date   HAND SURGERY Left     Current Outpatient Medications  Medication Instructions   albuterol  (VENTOLIN  HFA) 108 (90 Base) MCG/ACT inhaler 1-2 puffs, Inhalation, Every 6 hours PRN   cholecalciferol (VITAMIN D3) 1,000 Units, Daily   clonazePAM (KLONOPIN) 0.5 mg, 3 times daily PRN   conjugated estrogens  (PREMARIN ) vaginal cream 1 Applicatorful, Vaginal, Daily   cyanocobalamin (VITAMIN B12) 500 mcg, Daily   lamoTRIgine (LAMICTAL) 25 mg, Daily   Specialty Vitamins Products (CORTICARE B PO) 4 tablets, Daily       Objective:   Physical Exam BP 116/66   Pulse 75   Temp 98 F (36.7 C) (Oral)   Resp 16   Ht 5' 1  (1.549 m)   Wt 149 lb 8 oz (67.8 kg)   LMP 05/23/2019 (Approximate)   SpO2 97%   BMI 28.25 kg/m  General:   Well developed, NAD, BMI noted.  HEENT:  Normocephalic . Face symmetric, atraumatic Lungs:  CTA B Normal respiratory effort, no intercostal retractions, no accessory muscle use. Heart: RRR,  no murmur.  Abdomen:  Not distended, soft, very mild tenderness at the suprapubic area without mass or rebound. Skin: Not pale. Not jaundice Lower extremities: no pretibial edema bilaterally  Neurologic:  alert & oriented X3.  Speech normal, gait appropriate for age and unassisted Psych--  Cognition and judgment appear intact.  Cooperative with normal attention span and concentration.  Behavior appropriate. No anxious or depressed appearing.     Assessment     53 year old female, menopausal for 1 year, history of bipolar, history of PVCs/PACs, lung nodule, no previous abdominal pelvic surgeries, presents with  Fever: Presents with fever associated with aches, shivering, loose stools, mild abdominal discomfort. DDx includes a viral syndrome with diarrhea, colitis, early diverticulitis, early appendicitis (less likely). She feels well this afternoon noting that she took ibuprofen about 2 hours ago. We agreed on the following: UA, urine culture, UPT, CMP, CBC. Will talk tomorrow, if she is not getting better we will proceed with a CT abdomen and pelvis. Seek medical attention if symptoms get worse overnight.

## 2023-11-03 LAB — CBC WITH DIFFERENTIAL/PLATELET
Basophils Absolute: 0.1 10*3/uL (ref 0.0–0.1)
Basophils Relative: 0.9 % (ref 0.0–3.0)
Eosinophils Absolute: 0.3 10*3/uL (ref 0.0–0.7)
Eosinophils Relative: 5.9 % — ABNORMAL HIGH (ref 0.0–5.0)
HCT: 40 % (ref 36.0–46.0)
Hemoglobin: 13.1 g/dL (ref 12.0–15.0)
Lymphocytes Relative: 40.2 % (ref 12.0–46.0)
Lymphs Abs: 2.3 10*3/uL (ref 0.7–4.0)
MCHC: 32.8 g/dL (ref 30.0–36.0)
MCV: 78.6 fl (ref 78.0–100.0)
Monocytes Absolute: 0.6 10*3/uL (ref 0.1–1.0)
Monocytes Relative: 11.1 % (ref 3.0–12.0)
Neutro Abs: 2.4 10*3/uL (ref 1.4–7.7)
Neutrophils Relative %: 41.9 % — ABNORMAL LOW (ref 43.0–77.0)
Platelets: 323 10*3/uL (ref 150.0–400.0)
RBC: 5.09 Mil/uL (ref 3.87–5.11)
RDW: 13.6 % (ref 11.5–15.5)
WBC: 5.7 10*3/uL (ref 4.0–10.5)

## 2023-11-03 LAB — URINALYSIS, ROUTINE W REFLEX MICROSCOPIC
Bilirubin Urine: NEGATIVE
Hgb urine dipstick: NEGATIVE
Ketones, ur: NEGATIVE
Leukocytes,Ua: NEGATIVE
Nitrite: NEGATIVE
RBC / HPF: NONE SEEN (ref 0–?)
Specific Gravity, Urine: 1.02 (ref 1.000–1.030)
Total Protein, Urine: NEGATIVE
Urine Glucose: NEGATIVE
Urobilinogen, UA: 0.2 (ref 0.0–1.0)
pH: 6 (ref 5.0–8.0)

## 2023-11-03 LAB — COMPREHENSIVE METABOLIC PANEL WITH GFR
ALT: 51 U/L — ABNORMAL HIGH (ref 0–35)
AST: 29 U/L (ref 0–37)
Albumin: 4.3 g/dL (ref 3.5–5.2)
Alkaline Phosphatase: 47 U/L (ref 39–117)
BUN: 20 mg/dL (ref 6–23)
CO2: 28 meq/L (ref 19–32)
Calcium: 9.1 mg/dL (ref 8.4–10.5)
Chloride: 104 meq/L (ref 96–112)
Creatinine, Ser: 0.76 mg/dL (ref 0.40–1.20)
GFR: 89.42 mL/min (ref >60.00)
Glucose, Bld: 97 mg/dL (ref 70–99)
Potassium: 3.8 meq/L (ref 3.5–5.1)
Sodium: 139 meq/L (ref 135–145)
Total Bilirubin: 0.6 mg/dL (ref 0.2–1.2)
Total Protein: 6.4 g/dL (ref 6.0–8.3)

## 2023-11-04 ENCOUNTER — Ambulatory Visit (HOSPITAL_BASED_OUTPATIENT_CLINIC_OR_DEPARTMENT_OTHER)
Admission: RE | Admit: 2023-11-04 | Discharge: 2023-11-04 | Disposition: A | Discharge status: Home / Self Care | Source: Ambulatory Visit | Attending: Internal Medicine | Admitting: Internal Medicine

## 2023-11-04 ENCOUNTER — Telehealth: Payer: Self-pay

## 2023-11-04 ENCOUNTER — Other Ambulatory Visit: Payer: Self-pay

## 2023-11-04 ENCOUNTER — Ambulatory Visit: Payer: Self-pay | Admitting: Internal Medicine

## 2023-11-04 ENCOUNTER — Encounter (HOSPITAL_BASED_OUTPATIENT_CLINIC_OR_DEPARTMENT_OTHER): Payer: Self-pay

## 2023-11-04 DIAGNOSIS — R1030 Lower abdominal pain, unspecified: Secondary | ICD-10-CM | POA: Diagnosis present

## 2023-11-04 DIAGNOSIS — R509 Fever, unspecified: Secondary | ICD-10-CM | POA: Diagnosis present

## 2023-11-04 DIAGNOSIS — R002 Palpitations: Secondary | ICD-10-CM | POA: Diagnosis present

## 2023-11-04 DIAGNOSIS — R0789 Other chest pain: Secondary | ICD-10-CM | POA: Insufficient documentation

## 2023-11-04 DIAGNOSIS — R911 Solitary pulmonary nodule: Secondary | ICD-10-CM

## 2023-11-04 LAB — URINE CULTURE
MICRO NUMBER:: 16612629
SPECIMEN QUALITY:: ADEQUATE

## 2023-11-04 MED ORDER — IOHEXOL 300 MG/ML  SOLN
100.0000 mL | Freq: Once | INTRAMUSCULAR | Status: AC | PRN
  Administered 2023-11-04: 100 mL via INTRAVENOUS

## 2023-11-04 NOTE — Telephone Encounter (Signed)
 Will call GYN tomorrow to make sure they received referral.

## 2023-11-04 NOTE — Telephone Encounter (Signed)
 CT ordered by Dr. Amon  IMPRESSION: 1. No acute intra-abdominal or pelvic abnormality. 2. Enhancing lesion measuring 1.7 cm in the region of the cervix. This could represent a cervical polyp or fibroid. Correlation with physical exam findings recommended to evaluate for possible cervical neoplasm. A pelvic MRI with IV contrast may also be of benefit.     Electronically Signed   By: Rogelia Myers M.D.   On: 11/04/2023 09:39

## 2023-11-04 NOTE — Telephone Encounter (Signed)
 LMOM Left a detailed message, has cervix growth or polyp.  Is very important to see gynecology, we already placed a referral, encouraged to call them. Urine culture shows some bacteria, I am planning to treated with antibiotic (amoxicillin  500 mg 3 times daily x 15 tablets) only if she is still running fevers or has LUTS.  She will call and let us  know.

## 2023-11-05 NOTE — Telephone Encounter (Signed)
 Spoke w/ Harlene at [facility]- they did receive referral and contacted Pt yesterday for an urgent appt, Pt informed them that she would call them back at a later date.

## 2023-11-19 ENCOUNTER — Telehealth (HOSPITAL_COMMUNITY): Payer: Self-pay | Admitting: *Deleted

## 2023-11-19 NOTE — Telephone Encounter (Signed)
 Left message as a reminder about her ETT scheduled for 11/26/23 at 2:45.

## 2023-11-23 ENCOUNTER — Ambulatory Visit (HOSPITAL_BASED_OUTPATIENT_CLINIC_OR_DEPARTMENT_OTHER)

## 2023-11-26 ENCOUNTER — Ambulatory Visit (HOSPITAL_COMMUNITY)
Admission: RE | Admit: 2023-11-26 | Discharge: 2023-11-26 | Disposition: A | Discharge status: Home / Self Care | Source: Ambulatory Visit

## 2023-11-26 DIAGNOSIS — R002 Palpitations: Secondary | ICD-10-CM | POA: Insufficient documentation

## 2023-11-26 DIAGNOSIS — R0789 Other chest pain: Secondary | ICD-10-CM | POA: Insufficient documentation

## 2023-11-26 LAB — EXERCISE TOLERANCE TEST
Angina Index: 0
Duke Treadmill Score: 8
Estimated workload: 10.1
Exercise duration (min): 8 min
Exercise duration (sec): 15 s
MPHR: 167 {beats}/min
Peak HR: 148 {beats}/min
Percent HR: 88 %
Rest HR: 93 {beats}/min
ST Depression (mm): 0 mm

## 2023-11-27 ENCOUNTER — Telehealth: Payer: Self-pay

## 2023-11-27 ENCOUNTER — Other Ambulatory Visit: Payer: Self-pay

## 2023-11-27 DIAGNOSIS — Z1231 Encounter for screening mammogram for malignant neoplasm of breast: Secondary | ICD-10-CM

## 2023-11-27 NOTE — Telephone Encounter (Signed)
 Massage sent to pt.

## 2023-11-27 NOTE — Telephone Encounter (Signed)
 Copied from CRM [id number]. Topic: Clinical - Request for Lab/Test Order >> Nov 27, 2023  8:04 AM Linda Underwood wrote: Reason for CRM: Patient states Dr. Frann told her to get a mammogram. She was going to go through the [facility] but they do not have any openings until September. She would like an order to have it done here please.  Mammogram ordered and sent pt message letting her know it was ordered.

## 2023-11-28 ENCOUNTER — Ambulatory Visit: Payer: Self-pay

## 2023-12-02 ENCOUNTER — Ambulatory Visit (HOSPITAL_BASED_OUTPATIENT_CLINIC_OR_DEPARTMENT_OTHER)
Admission: RE | Admit: 2023-12-02 | Discharge: 2023-12-02 | Disposition: A | Discharge status: Home / Self Care | Source: Ambulatory Visit

## 2023-12-02 DIAGNOSIS — R911 Solitary pulmonary nodule: Secondary | ICD-10-CM | POA: Insufficient documentation

## 2023-12-07 DIAGNOSIS — R002 Palpitations: Secondary | ICD-10-CM

## 2023-12-08 ENCOUNTER — Ambulatory Visit: Payer: Self-pay

## 2023-12-15 ENCOUNTER — Telehealth: Payer: Self-pay | Admitting: Neurology

## 2023-12-15 NOTE — Telephone Encounter (Signed)
 Called pt to try schedule appt, lvm for her to cll back to setup the appt.

## 2023-12-15 NOTE — Telephone Encounter (Signed)
 Copied from CRM #[id number]. Topic: Appointments - Appointment Scheduling >> Dec 15, 2023 12:07 PM Pinkey ORN wrote: Patient/patient representative is calling to schedule an appointment. Refer to attachments for appointment information. >> Dec 15, 2023 12:08 PM Pinkey ORN wrote: Patient is requesting to have an insulin test completed as well as blood work / labs. Patient call back number is [phone]

## 2023-12-22 ENCOUNTER — Ambulatory Visit (INDEPENDENT_AMBULATORY_CARE_PROVIDER_SITE_OTHER): Admitting: Family Medicine

## 2023-12-22 ENCOUNTER — Encounter: Payer: Self-pay | Admitting: Family Medicine

## 2023-12-22 VITALS — BP 110/68 | HR 84 | Temp 98.0°F | Resp 16 | Ht 61.0 in | Wt 147.4 lb

## 2023-12-22 DIAGNOSIS — E162 Hypoglycemia, unspecified: Secondary | ICD-10-CM | POA: Diagnosis not present

## 2023-12-22 NOTE — Progress Notes (Signed)
 Chief Complaint  Patient presents with   Follow-up    Follow Up     Subjective: Patient is a 53 y.o. female here for f/u.  Pt is having fluctuating sugars. She has had some shaking. She is concerned about her insulin  levels. No hx of DM. She is having some intentional wt loss. She is doing strength training.   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   Onycholysis 02/12/2018   Palpitations 01/25/2020   Postural dizziness with presyncope 01/20/2020   PVC (premature ventricular contraction) 01/20/2020   Objective: BP 110/68 (BP Location: Left Arm, Patient Position: Sitting)   Pulse 84   Temp 98 F (36.7 C) (Oral)   Resp 16   Ht 5' 1 (1.549 m)   Wt 147 lb 6.4 oz (66.9 kg)   LMP 05/23/2019 (Approximate)   SpO2 98%   BMI 27.85 kg/m  General: Awake, appears stated age Heart: RRR, no LE edema Lungs: CTAB, no rales, wheezes or rhonchi. No accessory muscle use Psych: Age appropriate judgment and insight, normal affect and mood  Assessment and Plan: Hypoglycemia - Plan: Insulin , Free and Total, Hemoglobin A1c  Will check above.  If she does have insulin  resistance, we will consider a low-dose of metformin XR 500 mg daily for a couple months.  Continue routine physical activity and strength training.  This will help improve insulin  resistance as well.  Counseled on diet.  Follow-up as originally scheduled. The patient voiced understanding and agreement to the plan.  Mabel Mt [place], DO 12/22/23  12:26 PM

## 2023-12-22 NOTE — Patient Instructions (Addendum)
 Keep the diet clean and stay active.  Consider yoga.   Give us  2-3 business days to get the results of your labs back.   We may consider a low dose medication for a short period of time.   Let us  know if you need anything.

## 2023-12-23 ENCOUNTER — Other Ambulatory Visit (INDEPENDENT_AMBULATORY_CARE_PROVIDER_SITE_OTHER)

## 2023-12-23 DIAGNOSIS — E162 Hypoglycemia, unspecified: Secondary | ICD-10-CM | POA: Diagnosis not present

## 2023-12-23 LAB — HEMOGLOBIN A1C: Hgb A1c MFr Bld: 6 % (ref 4.6–6.5)

## 2023-12-29 ENCOUNTER — Ambulatory Visit
Admission: RE | Admit: 2023-12-29 | Discharge: 2023-12-29 | Disposition: A | Discharge status: Home / Self Care | Source: Ambulatory Visit | Attending: Family Medicine | Admitting: Family Medicine

## 2023-12-29 DIAGNOSIS — Z1231 Encounter for screening mammogram for malignant neoplasm of breast: Secondary | ICD-10-CM

## 2023-12-31 ENCOUNTER — Ambulatory Visit: Payer: Self-pay | Admitting: Family Medicine

## 2023-12-31 LAB — INSULIN, FREE AND TOTAL
Free Insulin: 9 uU/mL
Total Insulin: 9 uU/mL

## 2024-01-01 ENCOUNTER — Other Ambulatory Visit: Payer: Self-pay | Admitting: Family Medicine

## 2024-01-01 DIAGNOSIS — R928 Other abnormal and inconclusive findings on diagnostic imaging of breast: Secondary | ICD-10-CM

## 2024-01-07 ENCOUNTER — Ambulatory Visit
Admission: RE | Admit: 2024-01-07 | Discharge: 2024-01-07 | Disposition: A | Discharge status: Home / Self Care | Source: Ambulatory Visit | Attending: Family Medicine | Admitting: Family Medicine

## 2024-01-07 ENCOUNTER — Ambulatory Visit

## 2024-01-07 DIAGNOSIS — R928 Other abnormal and inconclusive findings on diagnostic imaging of breast: Secondary | ICD-10-CM

## 2024-01-12 ENCOUNTER — Encounter: Payer: Self-pay | Admitting: Plastic Surgery

## 2024-02-01 ENCOUNTER — Ambulatory Visit

## 2024-02-01 VITALS — BP 106/68 | HR 66 | Ht 61.0 in | Wt 141.0 lb

## 2024-02-01 DIAGNOSIS — R002 Palpitations: Secondary | ICD-10-CM

## 2024-02-01 DIAGNOSIS — R0789 Other chest pain: Secondary | ICD-10-CM | POA: Diagnosis not present

## 2024-02-01 DIAGNOSIS — I441 Atrioventricular block, second degree: Secondary | ICD-10-CM | POA: Diagnosis not present

## 2024-02-01 DIAGNOSIS — R7303 Prediabetes: Secondary | ICD-10-CM | POA: Insufficient documentation

## 2024-02-01 NOTE — Assessment & Plan Note (Signed)
 Atypical symptoms. Good effort tolerance. Treadmill EKG stress test 11/26/2023 reassuring with good exercise capacity and no ischemia at 88% MPHR and 10.1 METS activity attained. Reassured her. Continue exercise as tolerated.

## 2024-02-01 NOTE — Patient Instructions (Signed)
 Medication Instructions:  Your physician recommends that you continue on your current medications as directed. Please refer to the Current Medication list given to you today.  *If you need a refill on your cardiac medications before your next appointment, please call your pharmacy*  Lab Work: NONE If you have labs (blood work) drawn today and your tests are completely normal, you will receive your results only by: MyChart Message (if you have MyChart) OR A paper copy in the mail If you have any lab test that is abnormal or we need to change your treatment, we will call you to review the results.  Testing/Procedures: NONE  Follow-Up: At [facility], you and your health needs are our priority.  As part of our continuing mission to provide you with exceptional heart care, our providers are all part of one team.  This team includes your primary Cardiologist (physician) and Advanced Practice Providers or APPs (Physician Assistants and Nurse Practitioners) who all work together to provide you with the care you need, when you need it.  Your next appointment:   1 year(s)  Provider:   Alean Kobus, MD    We recommend signing up for the patient portal called MyChart.  Sign up information is provided on this After Visit Summary.  MyChart is used to connect with patients for Virtual Visits (Telemedicine).  Patients are able to view lab/test results, encounter notes, upcoming appointments, etc.  Non-urgent messages can be sent to your provider as well.   To learn more about what you can do with MyChart, go to [email or web address].   Other Instructions

## 2024-02-01 NOTE — Assessment & Plan Note (Signed)
 Prediabetes with hemoglobin A1c 6 on 12/23/2023. Following up with PCP aggressively working on diet and lifestyle modifications.   Will defer to PCP to further follow-up on lipid panel for annual visits and recommend LDL target below 100 mg/dL.

## 2024-02-01 NOTE — Assessment & Plan Note (Signed)
 Noted on prior heart monitor September 2021. No significant evidence of this on recent heart monitor October 29 2512-day study. Reassured.

## 2024-02-01 NOTE — Progress Notes (Signed)
 Cardiology Consultation:    Date:  02/01/2024   ID:  Linda Underwood, DOB 18-Nov-1970, MRN [id number]  PCP:  Frann Mabel Mt, DO  Cardiologist:  Alean SAUNDERS Carlo Guevarra, MD   Referring MD: Frann Mabel Mt*   No chief complaint on file.    ASSESSMENT AND PLAN:   Linda Underwood 53 year old woman  history of chronic palpitations correlated with PVCs and PACs in the past and 1 instance of single dropped beat correlating with likely type I second-degree AV block on heart monitor September 2021, cardiac calcium score 0 October 2022 with extracardiac findings showing lung nodule up to 5 mm in size, prediabetes, remote history of smoking for 3 to 4 years in her 67s, dyslipidemia [being managed with diet and exercise]. Seen for further evaluation of palpitations. Reassuring treadmill exercise stress test 11/26/2023 without ischemia. Stable lung nodules on CT chest 12/02/2023. 14-day Zio patch monitor June 2025 noted rare ventricular and supraventricular ectopy burden and 2 short runs of SVT with the longest episode lasting 7 beats which appear to correlate with her symptoms.   Problem List Items Addressed This Visit     Second degree AV block, Mobitz type I   Noted on prior heart monitor September 2021. No significant evidence of this on recent heart monitor October 29 2512-day study. Reassured.       Palpitations - Primary   Chronic symptoms. Less frequent now. Reviewed symptoms potential triggers such as lack of sleep, physical or emotional stress, excessive stimulant such as caffeinated drinks or alcohol. Reassured her and advised to continue with regular lifestyle healthy diet and regular exercise.  At this time no further medications to be added given the reassuring findings on the heart monitor and stress test.      Chest pressure   Atypical symptoms. Good effort tolerance. Treadmill EKG stress test 11/26/2023 reassuring with good exercise capacity and no ischemia at 88% MPHR  and 10.1 METS activity attained. Reassured her. Continue exercise as tolerated.      Prediabetes   Prediabetes with hemoglobin A1c 6 on 12/23/2023. Following up with PCP aggressively working on diet and lifestyle modifications.   Will defer to PCP to further follow-up on lipid panel for annual visits and recommend LDL target below 100 mg/dL.       She wishes to follow-up tentatively in 1 year for overall cardiovascular risk assessment.   History of Present Illness:    Linda Underwood is a 53 y.o. female who is being seen today for follow-up visit. PCP is Frann, Mabel Mt*.  Last visit with me in the office was 10/30/2023.  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 Stays physically active.   history of chronic palpitations correlated with PVCs and PACs in the past and 1 instance of single dropped beat correlating with likely type I second-degree AV block on heart monitor September 2021, cardiac calcium score 0 October 2022 with extracardiac findings showing lung nodule up to 5 mm in size, remote history of smoking for 3 to 4 years in her 3s, dyslipidemia [being managed with diet and exercise]  Seen for further evaluation in the setting of symptoms of palpitations and exertional symptoms of chest pain at last office visit in June 2025.  After the monitor from June 2025 14-day study noted predominantly sinus rhythm average heart rate 81/min [ranging from 50 to 144 bpm], rare ventricular and supraventricular ectopy burden less than 1%, 2 short runs of SVT with the longest episode lasting  7 beats.  Symptoms reported [total 8] at times correlated with short run of SVT.  Treadmill exercise stress test 11/26/2023 noted good exercise capacity 8 minutes 15 seconds, 88% MPHR, 10.1 METS, no evidence of ischemia.  CT chest 12/02/2023 done to follow-up on previously noted lung nodule reported scattered pulmonary nodules bilaterally stable in appearance in comparison  to prior CT from October 2022. 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   cholecalciferol (VITAMIN D3) 25 MCG (1000 UNIT) tablet Take 1,000 Units by mouth daily.   clonazePAM (KLONOPIN) 0.5 MG tablet Take 0.5 mg by mouth 3 (three) times daily as needed for anxiety.   conjugated estrogens  (PREMARIN ) vaginal cream Place 1 Applicatorful vaginally daily.   lamoTRIgine (LAMICTAL) 25 MG tablet Take 25 mg by mouth daily.   Specialty Vitamins Products (CORTICARE B PO) Take 4 tablets by mouth daily.   [DISCONTINUED] vitamin B-12 (CYANOCOBALAMIN) 500 MCG tablet Take 500 mcg by mouth daily.     Allergies:   Patient has no known allergies.   Social History   Socioeconomic History   Marital status: Married    Spouse name: Not on file   Number of children: Not on file   Years of education: Not on file   Highest education level: Not on file  Occupational History   Not on file  Tobacco Use   Smoking status: Former    Types: Cigarettes   Smokeless tobacco: Never  Vaping Use   Vaping status: Never Used  Substance and Sexual Activity   Alcohol use: No   Drug use: No   Sexual activity: Yes    Partners: Male  Other Topics Concern   Not on file  Social History Narrative   Not on file   Social Drivers of Health   Financial Resource Strain: Not on file  Food  Insecurity: Not on file  Transportation Needs: Not on file  Physical Activity: Not on file  Stress: Not on file  Social Connections: Not on file     Family History: The patient's family history includes Peripheral Artery Disease in her father and paternal grandmother. There is no history of Breast cancer. ROS:   Please see the history of present illness.    All 14 point review of systems negative except as described per history of present illness.  EKGs/Labs/Other Studies Reviewed:    The following studies were reviewed today:   EKG:       Recent Labs: 10/13/2023: Magnesium 2.1; TSH 2.07 11/02/2023: ALT 51; BUN 20; Creatinine, Ser 0.76; Hemoglobin 13.1; Platelets 323.0; Potassium 3.8; Sodium 139  Recent Lipid Panel    Component Value Date/Time   CHOL 215 (H) 02/05/2022 1103   TRIG 84.0 02/05/2022 1103   HDL 64.90 02/05/2022 1103   CHOLHDL 3 02/05/2022 1103   VLDL 16.8 02/05/2022 1103   LDLCALC 134 (H) 02/05/2022 1103  LDLCALC 140 (H) 01/06/2020 0900    Physical Exam:    VS:  BP 106/68   Pulse 66   Ht 5' 1 (1.549 m)   Wt 141 lb 0.6 oz (64 kg)   LMP 05/23/2019 (Approximate)   SpO2 97%   BMI 26.65 kg/m     Wt Readings from Last 3 Encounters:  02/01/24 141 lb 0.6 oz (64 kg)  12/22/23 147 lb 6.4 oz (66.9 kg)  11/02/23 149 lb 8 oz (67.8 kg)     GENERAL:  Well nourished, well developed in no acute distress NECK: No JVD; No carotid bruits CARDIAC: RRR, S1 and S2 present, no murmurs, no rubs, no gallops CHEST:  Clear to auscultation without rales, wheezing or rhonchi  Extremities: No pitting pedal edema. Pulses bilaterally symmetric with radial 2+ and dorsalis pedis 2+ NEUROLOGIC:  Alert and oriented x 3  Medication Adjustments/Labs and Tests Ordered: Current medicines are reviewed at length with the patient today.  Concerns regarding medicines are outlined above.  No orders of the defined types were placed in this encounter.  No orders of the defined types were  placed in this encounter.   Signed, Alean jess Kobus, MD, MPH, [facility]. 02/01/2024 9:41 AM    [facility]

## 2024-02-01 NOTE — Assessment & Plan Note (Signed)
 Chronic symptoms. Less frequent now. Reviewed symptoms potential triggers such as lack of sleep, physical or emotional stress, excessive stimulant such as caffeinated drinks or alcohol. Reassured her and advised to continue with regular lifestyle healthy diet and regular exercise.  At this time no further medications to be added given the reassuring findings on the heart monitor and stress test.

## 2024-02-11 ENCOUNTER — Ambulatory Visit: Payer: Self-pay

## 2024-02-11 NOTE — Telephone Encounter (Signed)
 FYI Only or Action Required?: Action required by provider: pt would like a glucose monitor in order to check blood sugars at home.  Patient was last seen in primary care on 12/22/2023 by Frann Mabel Mt, DO.  Called Nurse Triage reporting Dizziness.  Symptoms began today.  Interventions attempted: Other: espresso coffee - with a lot of sugar and pt is going to follow up with a full meal.  Symptoms are: stable.  Triage Disposition: See Physician Within 24 Hours  Patient/caregiver understands and will follow disposition?: Yes     Reason for Disposition  [1] MODERATE dizziness (e.g., interferes with normal activities) AND [2] has NOT been evaluated by doctor (or NP/PA) for this  (Exception: Dizziness caused by heat exposure, sudden standing, or poor fluid intake.)  Answer Assessment - Initial Assessment Questions 1. DESCRIPTION: Describe your dizziness.     dizziness 2. LIGHTHEADED: Do you feel lightheaded? (e.g., somewhat faint, woozy, weak upon standing)     woozy 3. VERTIGO: Do you feel like either you or the room is spinning or tilting? (i.e., vertigo)     Almost like vertigo - pt felt like she was leaning 4. SEVERITY: How bad is it?  Do you feel like you are going to faint? Can you stand and walk?     yes 5. ONSET:  When did the dizziness begin?     today 6. AGGRAVATING FACTORS: Does anything make it worse? (e.g., standing, change in head position)     unsure 7. HEART RATE: Can you tell me your heart rate? How many beats in 15 seconds?  (Note: Not all patients can do this.)       na 8. CAUSE: What do you think is causing the dizziness? (e.g., decreased fluids or food, diarrhea, emotional distress, heat exposure, new medicine, sudden standing, vomiting; unknown)     Shaking, unbalanced, weak 9. RECURRENT SYMPTOM: Have you had dizziness before? If Yes, ask: When was the last time? What happened that time?     Yes but unsure if it is  related 10. OTHER SYMPTOMS: Do you have any other symptoms? (e.g., fever, chest pain, vomiting, diarrhea, bleeding)       no 11. PREGNANCY: Is there any chance you are pregnant? When was your last menstrual period?       Na   Normal low BP: pt stated she is a new dx of diabetes and is afraid either BP or BS is low.  Pt is requesting a glucose monitor in order to check BS at home.  Pt is stable at present therefore dizziness is gone now.  Nurse provided education and examples of things that could increase BS, ex orange juice and peanut butter and to follow up with meal.  Protocols used: Dizziness - Lightheadedness-A-AH

## 2024-02-11 NOTE — Telephone Encounter (Signed)
 Copied from CRM #[id number]. Topic: Clinical - Red Word Triage >> Feb 11, 2024  8:09 AM Berneda FALCON wrote: Red Word that prompted transfer to Nurse Triage: Patient states she woke up feeling dizzy, possible low blood sugar/low blood pressure.  She states she does not normally eat this early but did and is slowly feeling better. Does not have a glucose monitor to check blood sugar but was dx with elevated A1C.  PAS called NT to give report on patient: patient disconnected before speaking to a nurse.   Nurse attempted to call pt back: no answer: left voicemail to call us  back.

## 2024-02-11 NOTE — Telephone Encounter (Signed)
 Appt scheduled

## 2024-02-11 NOTE — Telephone Encounter (Signed)
 Copied from CRM #[id number]. Topic: Clinical - Red Word Triage >> Feb 11, 2024  8:09 AM Berneda FALCON wrote: Red Word that prompted transfer to Nurse Triage: Patient states she woke up feeling dizzy, possible low blood sugar/low blood pressure.  She states she does not normally eat this early but did and is slowly feeling better. Does not have a glucose monitor to check blood sugar but was dx with elevated A1C.  PAS called NT to give report on patient: patient disconnected before speaking to a nurse.

## 2024-02-12 ENCOUNTER — Ambulatory Visit: Admitting: Family Medicine

## 2024-02-12 ENCOUNTER — Encounter: Payer: Self-pay | Admitting: Family Medicine

## 2024-02-12 VITALS — BP 112/64 | HR 88 | Temp 97.4°F | Resp 16 | Ht 61.0 in | Wt 141.2 lb

## 2024-02-12 DIAGNOSIS — H8112 Benign paroxysmal vertigo, left ear: Secondary | ICD-10-CM

## 2024-02-12 NOTE — Progress Notes (Signed)
 Chief Complaint  Patient presents with   Dizziness    Linda Underwood is 53 y.o. pt here for dizziness.  Duration: 1 day Pass out? No Spinning? Yes; lasts around 1 hr Recent illness/fever? No Headache? No Neurologic signs? No Change in PO intake? No Palpitations? Not a/w this No illicit drug use or alcohol.  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   Onycholysis 02/12/2018   Palpitations 01/25/2020   Postural dizziness with presyncope 01/20/2020   PVC (premature ventricular contraction) 01/20/2020    BP 112/64 (BP Location: Left Arm, Patient Position: Sitting)   Pulse 88   Temp (!) 97.4 F (36.3 C) (Temporal)   Resp 16   Ht 5' 1 (1.549 m)   Wt 141 lb 3.2 oz (64 kg)   LMP 05/23/2019 (Approximate)   SpO2 98%   BMI 26.68 kg/m  General: Awake, alert, appears stated age Eyes: PERRLA, EOMi Ears: Patent, TM's neg b/l Heart: RRR, no murmurs, no carotid bruits Lungs: CTAB, no accessory muscle use MSK: 5/5 strength throughout, gait normal Neuro: No cerebellar signs, patellar reflex 2/4 b/l wo clonus, calcaneal reflex 0/4 b/l wo clonus, biceps reflex 1/4 b/l wo clonus; Dix-Hall-Pike negative on R, + on L. Psych: Age appropriate judgment and insight, normal mood and affect  Benign paroxysmal positional vertigo of left ear  Stay hydrated.  Epley maneuver's provided.  If still having issues by next week, will refer to physical therapy/vestibular rehab. F/u prn. Pt voiced understanding and agreement to the plan.  Mabel Mt [place], DO 02/12/24 12:11 PM

## 2024-02-12 NOTE — Patient Instructions (Signed)
 Stay hydrated.  Send me a message early next week if we are still having issues.  Consider referencing YouTube for the Epley Maneuver.  Let us  know if you need anything.

## 2024-03-15 ENCOUNTER — Encounter: Payer: Self-pay | Admitting: Plastic Surgery

## 2024-04-27 ENCOUNTER — Ambulatory Visit: Payer: Self-pay

## 2024-04-27 ENCOUNTER — Ambulatory Visit: Admitting: Family Medicine

## 2024-04-27 ENCOUNTER — Encounter: Payer: Self-pay | Admitting: Family Medicine

## 2024-04-27 VITALS — BP 98/68 | HR 80 | Temp 98.9°F | Ht 61.0 in | Wt 140.4 lb

## 2024-04-27 DIAGNOSIS — J069 Acute upper respiratory infection, unspecified: Secondary | ICD-10-CM

## 2024-04-27 LAB — POCT INFLUENZA A/B
Influenza A, POC: NEGATIVE
Influenza B, POC: NEGATIVE

## 2024-04-27 LAB — POC COVID19 BINAXNOW: SARS Coronavirus 2 Ag: NEGATIVE

## 2024-04-27 MED ORDER — HYDROCOD POLI-CHLORPHE POLI ER 10-8 MG/5ML PO SUER: 5.0000 mL | Freq: Two times a day (BID) | ORAL | 0 refills | Status: AC | PRN

## 2024-04-27 MED ORDER — BENZONATATE 200 MG PO CAPS: 200.0000 mg | ORAL_CAPSULE | Freq: Two times a day (BID) | ORAL | 0 refills | Status: DC | PRN

## 2024-04-27 NOTE — Telephone Encounter (Signed)
 Appt scheduled

## 2024-04-27 NOTE — Progress Notes (Signed)
 Acute Office Visit  Subjective:  Patient ID: Linda Underwood, female    DOB: 13-Dec-1970  Age: 53 y.o. MRN: [id number]  CC:  Chief Complaint  Patient presents with   Cough    Onset - 04/24/24   Fever    Patient stated she has been taking Advil for fever   Nasal Congestion   chest congestion   sore body    Sore body all over from coughing & sneezing       HPI Linda Underwood is here for Cough.    Discussed the use of AI scribe software for clinical note transcription with the patient, who gave verbal consent to proceed.  History of Present Illness Linda Underwood is a 53 year old female with bronchitis and allergies who presents with worsening respiratory symptoms.  She became acutely ill approximately three days ago with initial symptoms of sneezing and a dry cough. She feels achy all over and has experienced persistent sneezing for three days. She attempted to manage her symptoms with honey and lemon, but her condition did not improve.  By Monday, her voice began to change, and she developed shortness of breath without fever. She experienced episodes of sweating followed by feeling cold. Today, her symptoms have worsened, with a fever reaching 101.17F in the morning and increasing to 102F by the time she sought medical care. She describes chest soreness, particularly when coughing, and notes that her back also hurts, which she has not experienced before.  She has a history of bronchitis and allergies, which she manages with lifestyle modifications and medication as needed. She mentions having been prescribed albuterol  in the past for her allergies but prefers to avoid medication unless necessary. She started taking Mucinex for the first time today to address her symptoms and it did seem to help.         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   Onycholysis 02/12/2018   Palpitations 01/25/2020   Postural dizziness with presyncope 01/20/2020   PVC (premature ventricular contraction) 01/20/2020    Past Surgical History:  Procedure Laterality Date   HAND SURGERY Left     Family History  Problem Relation Age of Onset   Peripheral Artery Disease Father    Peripheral Artery Disease Paternal Grandmother    Breast cancer Neg Hx     Social History   Socioeconomic History   Marital status: Married    Spouse name: Not on file   Number of children: Not on file   Years of education: Not on file   Highest education level: Not on file  Occupational History   Not on file  Tobacco Use   Smoking status: Former    Types: Cigarettes   Smokeless tobacco: Never  Vaping Use   Vaping status: Never Used  Substance and Sexual Activity   Alcohol use: No   Drug use: No   Sexual activity: Yes    Partners: Male  Other Topics Concern   Not on file  Social History Narrative   Not on file   Social Drivers of Health   Tobacco Use: Medium Risk (04/27/2024)   Patient History    Smoking Tobacco Use: Former    Smokeless Tobacco Use: Never    Passive Exposure: Not on Actuary Strain: Not on  file  Food Insecurity: Not on file  Transportation Needs: Not on file  Physical Activity: Not on file  Stress: Not on file  Social Connections: Not on file  Intimate Partner Violence: Not on file  Depression (PHQ2-9): Low Risk (04/27/2024)   Depression (PHQ2-9)    PHQ-2 Score: 3  Alcohol Screen: Not on file  Housing: Not on file  Utilities: Not on file  Health Literacy: Not on file    ROS All ROS negative except what is listed in the HPI.   Objective:   Today's Vitals: BP 98/68 (BP Location: Right Arm, Patient Position: Sitting, Cuff Size: Normal)   Pulse 80   Temp 98.9 F (37.2 C) (Oral)   Ht 5' 1 (1.549 m)   Wt 140 lb 6.4 oz (63.7 kg)   LMP 05/23/2019    SpO2 95%   BMI 26.53 kg/m   Physical Exam Vitals reviewed.  Constitutional:      General: She is not in acute distress.    Appearance: Normal appearance. She is ill-appearing.  HENT:     Right Ear: Tympanic membrane normal.     Left Ear: Tympanic membrane normal.     Mouth/Throat:     Pharynx: No oropharyngeal exudate or posterior oropharyngeal erythema.  Eyes:     Conjunctiva/sclera: Conjunctivae normal.  Cardiovascular:     Rate and Rhythm: Normal rate and regular rhythm.     Heart sounds: Normal heart sounds.  Pulmonary:     Effort: Pulmonary effort is normal.     Breath sounds: Normal breath sounds. No wheezing, rhonchi or rales.  Lymphadenopathy:     Cervical: No cervical adenopathy.  Skin:    General: Skin is warm and dry.  Neurological:     Mental Status: She is alert and oriented to person, place, and time.  Psychiatric:        Mood and Affect: Mood normal.        Behavior: Behavior normal.        Thought Content: Thought content normal.        Judgment: Judgment normal.     Assessment & Plan:   Problem List Items Addressed This Visit   None Visit Diagnoses       Viral URI with cough    -  Primary Flu and COVID negative today Discussed likely viral given presentation/timing Continue supportive measures including rest, hydration, humidifier use, steam showers, warm compresses to sinuses, warm liquids with lemon and honey, and over-the-counter cough, cold, and analgesics as needed.  Sending in Tessalon  and Tussionex - medications discussed. Continue Mucinex. Patient aware of signs/symptoms requiring further/urgent evaluation.  Follow-up if not improving by day 8-10.    Relevant Medications   benzonatate  (TESSALON ) 200 MG capsule   chlorpheniramine-HYDROcodone (TUSSIONEX) 10-8 MG/5ML   Other Relevant Orders   POC COVID-19 BinaxNow (Completed)   POCT Influenza A/B (Completed)         Follow-up: Return if symptoms worsen or fail to improve.   Waddell FURY Almarie, DNP, FNP-C  I,Emily Lagle,acting as a neurosurgeon for Waddell KATHEE Almarie, NP.,have documented all relevant documentation on the behalf of Waddell KATHEE Almarie, NP.   I, Waddell KATHEE Almarie, NP, have reviewed all documentation for this visit. The documentation on 04/27/2024 for the exam, diagnosis, procedures, and orders are all accurate and complete.

## 2024-04-27 NOTE — Telephone Encounter (Signed)
 FYI Only or Action Required?: FYI only for provider: appointment scheduled on 04/27/24.  Patient was last seen in primary care on 02/12/2024 by Frann Mabel Mt, DO.  Called Nurse Triage reporting Cough.  Symptoms began 5 day ago.  Interventions attempted: OTC medications: mucinex and Rest, hydration, or home remedies.  Symptoms are: gradually worsening.  Triage Disposition: Call PCP Now   Patient/caregiver understands and will follow disposition?:  Copied from CRM #[id number]. Topic: Clinical - Red Word Triage >> Apr 27, 2024  8:04 AM Rosina BIRCH wrote: Red Word that prompted transfer to Nurse Triage: cough, pain in chest and back, fever this morning-101.5 Reason for Disposition  [1] Influenza diagnosed or suspected (e.g., flu present in the community, known flu exposure) AND [2] FLU symptoms (e.g., cough with fever)  Answer Assessment - Initial Assessment Questions 1. RESPIRATORY STATUS: Describe your breathing? (e.g., wheezing, shortness of breath, unable to speak, severe coughing)      SOB/ CP with cough 2. ONSET: When did this asthma attack begin?      Cough onset over the weekend 3. TRIGGER: What do you think triggered this attack? (e.g., URI, exposure to pollen or other allergen, tobacco smoke)      Thinks maybe has the flu  5. SEVERITY: How bad is this attack?      Cough is persistent, no wheezing 6. ASTHMA MEDICINES:  What treatments have you tried?      Home treatments, mucinex 7. INHALED QUICK-RELIEF TREATMENTS FOR THIS ATTACK: What treatments have you given yourself so far? and How many and how often? If using an inhaler, ask, How many puffs? Note: Routine treatments are 2 puffs every 4 hours as needed. Rescue treatments are 4 puffs repeated every 20 minutes, up to three times as needed.      Has albuterol  hasn't taken yet 8. OTHER SYMPTOMS: Do you have any other symptoms? (e.g., chest pain, coughing up yellow sputum, fever, runny nose)     C/O body  aches, fever onset this am.  9. O2 SATURATION MONITOR:  Do you use an oxygen saturation monitor (pulse oximeter) at home? If Yes, What is your reading (oxygen level) today? What is your usual oxygen saturation reading? (e.g., 95%)     Coughing up sputum, unknown color  Protocols used: Asthma Attack-A-AH

## 2024-06-15 ENCOUNTER — Ambulatory Visit: Admitting: Family Medicine

## 2024-06-15 ENCOUNTER — Ambulatory Visit: Payer: Self-pay | Admitting: Family Medicine

## 2024-06-15 ENCOUNTER — Ambulatory Visit: Payer: Self-pay

## 2024-06-15 ENCOUNTER — Encounter: Payer: Self-pay | Admitting: Family Medicine

## 2024-06-15 VITALS — BP 110/70 | HR 87 | Temp 98.0°F | Resp 16 | Ht 61.0 in | Wt 140.0 lb

## 2024-06-15 DIAGNOSIS — R3915 Urgency of urination: Secondary | ICD-10-CM

## 2024-06-15 DIAGNOSIS — M79605 Pain in left leg: Secondary | ICD-10-CM

## 2024-06-15 LAB — URINALYSIS, MICROSCOPIC ONLY

## 2024-06-15 MED ORDER — PREDNISONE 20 MG PO TABS: 40.0000 mg | ORAL_TABLET | Freq: Every day | ORAL | 0 refills | Status: AC

## 2024-06-15 MED ORDER — METHOCARBAMOL 500 MG PO TABS: 500.0000 mg | ORAL_TABLET | Freq: Three times a day (TID) | ORAL | 0 refills | Status: AC | PRN

## 2024-06-15 NOTE — Patient Instructions (Addendum)
 Heat (pad or rice pillow in microwave) over affected area, 10-15 minutes twice daily.   Ice/cold pack over area for 10-15 min twice daily.  OK to take Tylenol 1000 mg (2 extra strength tabs) or 975 mg (3 regular strength tabs) every 6 hours as needed.  Stay hydrated.   Warning signs/symptoms: Uncontrollable nausea/vomiting, fevers, worsening symptoms despite treatment, confusion.  Give us  around 2 business days to get culture back to you.  Let us  know if you need anything.   Adductor Muscle Strain With Rehab Do exercises exactly as told by your health care provider and adjust them as directed. It is normal to feel mild stretching, pulling, tightness, or mild discomfort as you do these exercises. Stop right away if you feel sudden pain or your pain gets worse.  Strengthening exercises These exercises build strength and endurance in your thighs. Endurance is theability to use your muscles for a long time, even after your muscles get tired. Hip adductor isometrics  This exercise is sometimes called inner thigh squeeze. Sit on a firm chair that positions your knees at about the same height as your hips. Place a large ball, firm pillow, or rolled-up bath towel between your thighs. Squeeze your thighs together, gradually building tension. Hold for 3 seconds. Release the tension gradually. Allow your inner thigh muscles to relax completely before you start the next repetition. Repeat 9 times for 10 total reps and repeat 2 more sets. Complete this exercise 3 times a week. Hip adduction  This exercise is sometimes called side lying straight leg raises. Lie on your side so your head, shoulder, knee, and hip are in a straight line with each other. To help maintain your balance, you may put the foot of your top leg in front of the leg that is on the floor. Your left / right leg should be on the bottom. Roll your hips slightly forward so your hips are stacked directly over each other and your left  / right knee is facing forward. Tense the muscles of your inner thigh and lift your bottom leg 4-6 inches (10-15 cm). Hold this position for 3 seconds. Slowly lower your leg to the starting position. Allow your muscles to relax completely before you start the next repetition. Repeat 9 times for 10 total reps and repeat 2 more sets. Complete this exercise 3 times a week. Hip extension  This exercise is sometimes called prone (on your belly) straight leg raises. Lie on your belly on a bed or a firm surface with a pillow under your hips. Tense your buttock muscles and lift your left / right thigh off the bed. Your left / right knee can be bent or straight, but do not let your back arch. Hold this position for 3 seconds. Slowly return to the starting position. Allow your muscles to relax completely before you start the next repetition. Repeat 9 times for 10 total reps and repeat 2 more sets. Complete this exercise 3 times a week.  Balance exercises These exercises improve or maintain your balance. Balance is important inpreventing falls. Single-leg balance Stand near a railing or by a door frame that you can hold onto as needed. Stand on your left / right foot. Keep your big toe down on the floor and try to keep your arch lifted. If this is too easy, you can stand with your eyes closed or stand on a pillow. Hold this position for 5-10 seconds. Repeat 2 times. Complete this exercise 3 times a week.  Side lunges Stand with your feet together. Keeping one foot in place, step to the side with your other foot about 1-3 feet depending on your comfort. Do not step so far that you feel discomfort in your middle thigh. Push off from your stepping foot to return to the starting position. Repeat 9 times for 10 total reps and repeat 2 more sets. Complete this exercise 3 times a week. This information is not intended to replace advice given to you by your health care provider. Make sure you discuss any  questions you have with your healthcare provider. Document Revised: 08/17/2018 Document Reviewed: 01/26/2018 Elsevier Patient Education  2022 [employer].

## 2024-06-15 NOTE — Progress Notes (Signed)
 Musculoskeletal Exam  Patient: Linda Underwood DOB: 11-28-1970  DOS: 06/15/2024  SUBJECTIVE:  Chief Complaint:   Chief Complaint  Patient presents with   Leg Pain    Leg Pain    Linda Underwood is a 54 y.o.  female for evaluation and treatment of L leg pain.   Onset:  a couple hrs ago. No inj, she has been sitting more than usual.  Location: front of thigh and down to ankle Character:  burning  Progression of issue:  is unchanged Associated symptoms: does not hurt to touch No bruising, redness, swelling, catching/locking, walking issues Treatment: to date has been: none.   Neurovascular symptoms: no  Over past week, has had urinary freq and urgency. Sometimes has to strain more. Also having hesitancy and retention. No bleeding, dc, incontinence, fevers, N/V.   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 No acute distress. Thorax & Lungs: No accessory muscle use Musculoskeletal: L hip/thigh.   Normal active range of motion: yes.   Normal passive range of motion: yes Tenderness to palpation: yes over medial prox L thigh No calf ttp, no edema.  Deformity: no Ecchymosis: no Tests positive: Stinchfield Tests negative: log roll and log roll in flexion, FABER, FADIR, straight leg, Homan's Neurologic: Normal sensory function. Gait antalgic Psychiatric: Normal mood. Age  appropriate judgment and insight. Alert & oriented x 3.    Assessment:  Pain of left lower extremity - Plan: predniSONE  (DELTASONE ) 20 MG tablet, methocarbamol  (ROBAXIN ) 500 MG tablet  Urgency of urination - Plan: Urine Culture, Urine Microscopic Only  Plan: Suspect psoas and adductor muscle involvement. Tx as above. Stretches/exercises, heat, ice, Tylenol.  Ck urine. Not fully typical s/s's for her.  F/u prn. The patient voiced understanding and agreement to the plan.   Mabel Mt [place], DO 06/15/24  11:28 AM

## 2024-06-15 NOTE — Telephone Encounter (Signed)
 FYI Only or Action Required?: FYI only for provider: appointment scheduled on 06/15/24.  Patient was last seen in primary care on 04/27/2024 by Almarie Waddell NOVAK, NP.  Called Nurse Triage reporting Pain.  Symptoms began today.  Interventions attempted: Nothing.  Symptoms are: gradually worsening.  Triage Disposition: See Physician Within 24 Hours  Patient/caregiver understands and will follow disposition?: Yes    Message from Kendralyn S sent at 06/15/2024 10:11 AM EST  Reason for Triage: burning pain going from groin area down to foot    Reason for Disposition  Numbness in a leg or foot (i.e., loss of sensation)  Answer Assessment - Initial Assessment Questions Pt called to report new onset of burning pain from her L groin/pelvic bone down her L medial thigh into her foot. Pt denies any injury or trauma; pt has not tried any interventions at this time. Pt reports she fell x 1 week ago but fell on her right side. No numbness, no radiation of pain. Appointment scheduled for evaluation. Patient agrees with plan of care, and will call back if anything changes, or if symptoms worsen.      1. ONSET: When did the pain start?      X 30 minutes ago   2. LOCATION: Where is the pain located?      From L side of groin/pelvic bone down L medial leg to foot   3. PAIN: How bad is the pain?    (Scale 1-10; or mild, moderate, severe)     Burning pain   4. WORK OR EXERCISE: Has there been any recent work or exercise that involved this part of the body?      No; pt reports a fall x 1 week ago but fell on R side   5. CAUSE: What do you think is causing the leg pain?     Unsure   6. OTHER SYMPTOMS: Do you have any other symptoms? (e.g., chest pain, back pain, breathing difficulty, swelling, rash, fever, numbness, weakness)     None  Protocols used: Leg Pain-A-AH

## 2024-06-15 NOTE — Telephone Encounter (Signed)
 Appt scheduled

## 2024-06-17 LAB — URINE CULTURE
MICRO NUMBER:: 17547954
Result:: NO GROWTH
SPECIMEN QUALITY:: ADEQUATE
